# Patient Record
Sex: Male | Born: 1955
Health system: Southern US, Community
[De-identification: ages and names within clinical notes are randomized; demographics above are authoritative.]

## PROBLEM LIST (undated history)

## (undated) DIAGNOSIS — E785 Hyperlipidemia, unspecified: Secondary | ICD-10-CM

## (undated) DIAGNOSIS — I4891 Unspecified atrial fibrillation: Secondary | ICD-10-CM

## (undated) DIAGNOSIS — S76911A Strain of unspecified muscles, fascia and tendons at thigh level, right thigh, initial encounter: Secondary | ICD-10-CM

## (undated) DIAGNOSIS — T884XXA Failed or difficult intubation, initial encounter: Secondary | ICD-10-CM

## (undated) DIAGNOSIS — E559 Vitamin D deficiency, unspecified: Secondary | ICD-10-CM

## (undated) HISTORY — PX: HAGLAND'S DEFORMITY EXCISION: SHX1718

## (undated) HISTORY — DX: Strain of unspecified muscles, fascia and tendons at thigh level, right thigh, initial encounter: S76.911A

## (undated) HISTORY — PX: KNEE SURGERY: SHX244

## (undated) HISTORY — DX: Unspecified atrial fibrillation: I48.91

## (undated) HISTORY — DX: Hyperlipidemia, unspecified: E78.5

## (undated) HISTORY — DX: Vitamin D deficiency, unspecified: E55.9

---

## 2016-08-19 ENCOUNTER — Other Ambulatory Visit: Payer: Self-pay | Admitting: Orthopedic Surgery

## 2016-08-19 DIAGNOSIS — M25551 Pain in right hip: Secondary | ICD-10-CM

## 2016-08-22 ENCOUNTER — Ambulatory Visit
Admission: RE | Admit: 2016-08-22 | Discharge: 2016-08-22 | Disposition: A | Discharge status: Home / Self Care | Payer: Commercial Managed Care - PPO | Source: Ambulatory Visit | Attending: Orthopedic Surgery | Admitting: Orthopedic Surgery

## 2016-08-22 DIAGNOSIS — M25551 Pain in right hip: Secondary | ICD-10-CM

## 2016-08-24 ENCOUNTER — Ambulatory Visit: Payer: Self-pay

## 2016-08-24 ENCOUNTER — Encounter: Payer: Self-pay | Admitting: Sports Medicine

## 2016-08-24 ENCOUNTER — Ambulatory Visit (INDEPENDENT_AMBULATORY_CARE_PROVIDER_SITE_OTHER): Payer: Commercial Managed Care - PPO | Admitting: Sports Medicine

## 2016-08-24 VITALS — BP 129/83 | HR 67 | Ht 67.0 in | Wt 165.0 lb

## 2016-08-24 DIAGNOSIS — M7662 Achilles tendinitis, left leg: Secondary | ICD-10-CM

## 2016-08-24 DIAGNOSIS — M79605 Pain in left leg: Secondary | ICD-10-CM | POA: Diagnosis not present

## 2016-08-24 MED ORDER — NITROGLYCERIN 0.2 MG/HR TD PT24: MEDICATED_PATCH | TRANSDERMAL | 1 refills | Status: DC

## 2016-08-24 NOTE — Progress Notes (Signed)
   Subjective:    Patient ID: Bryan Stevens, male    DOB: September 22, 1956, 60 y.o.   MRN: [id number]  HPI   Bryan Stevens is 60 y.o. male who presents with left achilles pain. Pain began 3 months ago. Reports that in April of this year [place] and then developed plantar fascitis. He stopped running and when he returned to running in July the achilles pain started.  Patient has been a long distance runner for over 40 years. Pain is worse with running and he has taken the last 11 days off of running.   Soc Hx: Nurse, adulthead master at [employer]  Review of Systems: Edema of the left achilles.   PMH: History of achilles tendonitis and Haglund's deformity bilaterally s/p surgical repair (right in 2008, left in 2011).       Objective:   Physical Exam   Blood pressure 129/83, pulse 67, height 5\' 7"  (1.702 m), weight 165 lb (74.8 kg). Pleasant, WDWN male in NAD   Edema of the left achilles and pain/knot approximately 2.5 cm above the calcaneous.  Strength of ankle flex and ext intact bilaterally.  However complete motion of Flexion and extension of left ankle limited compared to right.  Gluteus medius strength lessened on left compared to right.   Foot shape moderately cavus Post heels with scars  US of Left AT There is nodular thickening noted Greatest AP diameter 1.02 cms Transverse view shows tendinopathic change with hypoechoic areas more in gastrocnemius portion Hypoechoic change at base with calcification Increased neovessel activity in the tendon  Assessment: chronic Achilles tendinopathy with nodular change    Assessment & Plan:   See Attestation from Preceptor for full A&P   I observed and examined the patient with the resident and agree with assessment and plan.  Note reviewed and modified by me. Sterling BigKB Fields, MD

## 2016-08-24 NOTE — Patient Instructions (Signed)

## 2016-09-01 DIAGNOSIS — M7662 Achilles tendinitis, left leg: Secondary | ICD-10-CM | POA: Insufficient documentation

## 2016-09-01 NOTE — Assessment & Plan Note (Signed)
Excellent candidate for NTG protocol  Alfredson exercises  Heel lift  Xtrain and modify  Icing  Reck 6 weeks

## 2016-09-02 ENCOUNTER — Ambulatory Visit: Payer: Self-pay | Admitting: Sports Medicine

## 2016-10-06 ENCOUNTER — Ambulatory Visit (INDEPENDENT_AMBULATORY_CARE_PROVIDER_SITE_OTHER): Payer: Commercial Managed Care - PPO | Admitting: Sports Medicine

## 2016-10-06 ENCOUNTER — Encounter: Payer: Self-pay | Admitting: Sports Medicine

## 2016-10-06 ENCOUNTER — Ambulatory Visit: Payer: Self-pay

## 2016-10-06 VITALS — BP 119/83 | HR 56 | Ht 67.0 in | Wt 168.0 lb

## 2016-10-06 DIAGNOSIS — M7662 Achilles tendinitis, left leg: Secondary | ICD-10-CM | POA: Diagnosis not present

## 2016-10-06 DIAGNOSIS — M869 Osteomyelitis, unspecified: Secondary | ICD-10-CM | POA: Diagnosis not present

## 2016-10-06 NOTE — Assessment & Plan Note (Signed)
He has made significant progress clinically  Very little change on ultrasound except for increased blood flow  Continue the same protocol for the next 2 months and then recheck with a repeat ultrasound at that time

## 2016-10-06 NOTE — Progress Notes (Signed)
Chief complaint  Left Achilles pain   Secondary complaint sports hernia on the right side  Patient is an avid [place] this past year After that time he has had a lot of pain in his right inguinal area and some in his left inguinal area He also developed swelling and pain over his left Achilles tendon Since his visit on October 24 he has been doing home exercises and a nitroglycerin protocol With those interventions he has been able to return to running without significant Achilles pain He is able to do 1 leg calf raises 3 sets of 15 No side effects from the nitroglycerin  Current limitations in running and exercise come more from pain in his groin particularly to the right side Dr. Ranell PatrickNorris had ordered an MRI of his right hip This showed some labral pathology but also edema in is pubic symphysis and adductor tendinopathy  Social history The patient is Teacher, musicheadmaster [employer] He is a nonsmoker  Review of systems Pain over his groin with lifting Pain was too much flexion of the right hip No cough or sneeze pain No radiation into the testicle Some pain at the midportion of the symphysis  Physical examination Muscular male in no acute distress BP 119/83   Pulse (!) 56   Ht 5\' 7"  (1.702 m)   Wt 168 lb (76.2 kg)   BMI 26.31 kg/m   Left Achilles still shows nodular swelling This is non tender today There is no redness He has good calf strength  Examination of his low abdomen reveals significant tenderness over the symphysis He has significant adductor weakness on the right with pain on testing Abductor strength is good Hip range of motion of the right is normal but more limited compared to the left Hip flexion and FADIR test are nonpainful No palpable hernia is noted  Left side shows some minimal tenderness at the adductor No other findings  Ultrasound of left Achilles tendon The Achilles is visualized in long and short axis There is a nodule about 4-6  cm proximal to the calcaneus The nodule measures 1.0-1.2 cm AP diameter There is less hypoechoic change in the tendon There is calcific change at the insertion that is the same as before Today he has increased neo-vessel activity consistent with nitroglycerin use  Impression: ultrasound is consistent with chronic Achilles tendinopathy with nodular change. There is an increase in vascularity and no change in structure from prior examination.  Ultrasound and interpretation OmnicomKarl Aby Gessel M.D.

## 2016-10-06 NOTE — Assessment & Plan Note (Signed)
He has adductor weakness and tightness on hip rotation  We will start with a protocol of hip exercises to correct strength deficits  And abdominal strengthening  Okay to train but avoid lifting or exercises that cause pubic area pain  Recheck 2 months

## 2016-11-01 DIAGNOSIS — J019 Acute sinusitis, unspecified: Secondary | ICD-10-CM | POA: Diagnosis not present

## 2016-12-09 ENCOUNTER — Ambulatory Visit: Payer: Commercial Managed Care - PPO | Admitting: Sports Medicine

## 2016-12-16 ENCOUNTER — Telehealth: Payer: Self-pay | Admitting: Neurology

## 2016-12-16 NOTE — Telephone Encounter (Signed)
Diannia RuderKara with Dr. Allayne Stackrossley's office called requesting to speak with Dr. Lucia GaskinsAhern.  Please call

## 2016-12-16 NOTE — Telephone Encounter (Signed)
Error

## 2016-12-16 NOTE — Telephone Encounter (Signed)
Candise BowensJen can you place patient on my schedule for next week at noon or 430 please?

## 2016-12-16 NOTE — Telephone Encounter (Signed)
Pt called back and sooner appt scheduled Tues 2/20 @ 4:00.

## 2016-12-16 NOTE — Telephone Encounter (Signed)
Called pt and offered sooner appt next week on either Tues 2/20 (@ 12 or 4) or Thurs 2/22 (@ 4:30). Asked pt to call back to schedule.

## 2016-12-16 NOTE — Telephone Encounter (Signed)
New pt appt is scheduled w/ Dr. Lucia GaskinsAhern on 01/03/17 for peripheral neuropathy.

## 2016-12-21 ENCOUNTER — Ambulatory Visit (INDEPENDENT_AMBULATORY_CARE_PROVIDER_SITE_OTHER): Payer: Commercial Managed Care - PPO | Admitting: Neurology

## 2016-12-21 ENCOUNTER — Encounter: Payer: Self-pay | Admitting: Neurology

## 2016-12-21 VITALS — Ht 67.5 in | Wt 170.2 lb

## 2016-12-21 DIAGNOSIS — G629 Polyneuropathy, unspecified: Secondary | ICD-10-CM

## 2016-12-21 NOTE — Progress Notes (Addendum)
[facility]    Provider:  Dr Lucia GaskinsAhern Referring Provider: Alysia PennaHolwerda, Scott, MD Primary Care Physician:  Alysia PennaHOLWERDA, SCOTT, MD  CC:  Neuropathy  HPI:  Bryan Stevens is a 61 y.o. male here as a referral from Dr. Link SnufferHolwerda for neuropathy. He had a severe case of the flu and sinus infection and bronchitis in December and in January he started getting numbness in the feet and fingers. It is better but not resolved. Mother with alzheimers in her 90s had memory issues for 16 years. Father with neuropathy in the feet in his 8370s and died in his 90s likely due to DM. He has tingling, paresthesias which have improved. In the ball of the foot and toes. Finges and the balls of his feet feel swollen. Feels weird in the toes. Especially when he walks. The left is worse than the right. Right has improved more though. More localized to the tips of the hands. Feels tender. There may be a little weakness as well, more sensory changes in the hands not feet. Not dropping things. 2 weeks ago he just fell, unclear why, he tripped over a crack and did not roll his ankle but just fell maybe because he couldn't feel his feet. He started getting cramping in the instep preceding these symptoms. He has to stand up to flatten out the foot. No problem with sheets touching his feet. The first few weeks it was more distressing at night. Continuous and the same all day long. Ibuprofen didn;t help. Father had neuropathy in his feet, had neuropathy at the age of 59 slowly progressive over a decade. Patient was a Pharmacist, hospitalcollege athlete, no difficulty with sports as a child or adolescent and he is a runne rnow. No back pain no radicular symptoms. No other focal neurologic deficits, associated symptoms, inciting events or modifiable factors.  Reviewed notes, labs and imaging from outside physicians, which showed: Patient was very sic late dec early January witht the flu. He developed severe numbness and tingling in the  hands/fimgers/feet/toes. Has a sinus infection and bronchitis as well. Paresthesias about a week after he was feeloing better. Feels swollen. Father had neuropathy but in the setting of DM later in life in his 1470s. No other hx of neuropathy in the past.   CMP, CBC, Vit D and B12/folate:  CMP unremarkable with BUN/Creatinine 18 and 0.9. CBC unremarkable. Vit D low 18.6. B12 673. Folate 18.9.   Review of Systems: Patient complains of symptoms per HPI as well as the following symptoms: no CP, no SOB. Pertinent negatives per HPI. All others negative.   Social History   Social History  . Marital status: Married    Spouse name: N/A  . Number of children: 3  . Years of education: BA, BS, MS, MA   Occupational History  . [employer]    Social History Main Topics  . Smoking status: Never Smoker  . Smokeless tobacco: Never Used  . Alcohol use Yes     Comment: 12 oz/mo  . Drug use: No  . Sexual activity: Not on file     Comment: Married   Other Topics Concern  . Not on file   Social History Narrative   Lives at home w/ his wife and children   Right-handed   Caffeine: 16 oz per month    Family History  Problem Relation Age of Onset  . Alzheimer's disease Mother   . Stroke Father   . Stroke Maternal Grandmother   . Colon cancer  Maternal Grandfather   . Pneumonia Paternal Grandmother   . Rheumatic fever Paternal Grandfather     Past Medical History:  Diagnosis Date  . Muscle strain of right thigh   . Vitamin D deficiency     Past Surgical History:  Procedure Laterality Date  . HAGLAND'S DEFORMITY EXCISION  09/2007, 09/2010    Current Outpatient Prescriptions  Medication Sig Dispense Refill  . cholecalciferol (VITAMIN D) 1000 units tablet Take 1,000 Units by mouth daily.    Marland Kitchen ibuprofen (ADVIL,MOTRIN) 200 MG tablet Take 400 mg by mouth every 8 (eight) hours as needed.     . Multiple Vitamin (MULTI-VITAMINS) TABS Take by mouth.    . Vitamin D, Ergocalciferol,  (DRISDOL) 50000 units CAPS capsule Take 50,000 Units by mouth every 7 (seven) days.    . nitroGLYCERIN (NITRODUR - DOSED IN MG/24 HR) 0.2 mg/hr patch Place 1/4 patch to affected area daily (Patient not taking: Reported on 12/21/2016) 30 patch 1   No current facility-administered medications for this visit.     Allergies as of 12/21/2016 - Review Complete 12/21/2016  Allergen Reaction Noted  . Gramineae pollens  01/30/2016    Vitals: Ht 5' 7.5" (1.715 m)   Wt 170 lb 3.2 oz (77.2 kg)   BMI 26.26 kg/m  Last Weight:  Wt Readings from Last 1 Encounters:  12/21/16 170 lb 3.2 oz (77.2 kg)   Last Height:   Ht Readings from Last 1 Encounters:  12/21/16 5' 7.5" (1.715 m)     Physical exam: Exam: Gen: NAD, conversant, well nourised, obese, well groomed                     CV: RRR, no MRG. No Carotid Bruits. No peripheral edema, warm, nontender Eyes: Conjunctivae clear without exudates or hemorrhage  Neuro: Detailed Neurologic Exam  Speech:    Speech is normal; fluent and spontaneous with normal comprehension.  Cognition:    The patient is oriented to person, place, and time;     recent and remote memory intact;     language fluent;     normal attention, concentration,     fund of knowledge Cranial Nerves:    The pupils are equal, round, and reactive to light. The fundi are normal and spontaneous venous pulsations are present. Visual fields are full to finger confrontation. Extraocular movements are intact. Trigeminal sensation is intact and the muscles of mastication are normal. The face is symmetric. The palate elevates in the midline. Hearing intact. Voice is normal. Shoulder shrug is normal. The tongue has normal motion without fasciculations.   Coordination:    Normal finger to nose and heel to shin. Normal rapid alternating movements.   Gait:    Heel-toe and tandem gait are normal.   Motor Observation:    No asymmetry, no atrophy, and no involuntary movements  noted. Tone:    Normal muscle tone.    Posture:    Posture is normal. normal erect    Strength:    Strength is V/V in the upper and lower limbs.      Sensation: intact to LT,  vibration and proprioception, impaired pp and temp in glove-and-stocking distribution     Reflex Exam:  DTR's:    Deep tendon reflexes in the upper and lower extremities are normal bilaterally.   Toes:    The toes are downgoing bilaterally.   Clonus:    Clonus is absent.     Assessment/Plan:  61 year old very health  nice gentleman who is high functioning and is a runner who developed paresthesias and numbness in his fingers and toes in the setting of flu, sinusitis and infection 6 weeks ago. The symptoms are continuous but improved. 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   Discussed the causes of peripheral neuropathy, the most common being diabetes which patient does not report having. About 20 million people in the [place] states have some form of peripheral neuropathy. This is a condition that develops as a result of damage to the peripheral nervous system. There are multiple causes including metabolic, toxic, infectious and endocrine disorders, small vessel disease, autoimmune diseases, and others.   -emg/ncs all 4 limbs including all 4 F wave, H reflexes  Naomie Dean, MD  [facility] [address] [place], [place] [address]  Phone [phone] Fax [phone]

## 2016-12-21 NOTE — Patient Instructions (Signed)
Remember to drink plenty of fluid, eat healthy meals and do not skip any meals. Try to eat protein with a every meal and eat a healthy snack such as fruit or nuts in between meals. Try to keep a regular sleep-wake schedule and try to exercise daily, particularly in the form of walking, 20-30 minutes a day, if you can.   As far as your medications are concerned, I would like to suggest  As far as diagnostic testing: Labs, emg/ncs  I would like to see you back for emg/ncs, sooner if we need to. Please call us with any interim questions, concerns, problems, updates or refill requests.    Our phone number is [phone]. We also have an after hours call service for urgent matters and there is a physician on-call for urgent questions. For any emergencies you know to call 911 or go to the nearest emergency room   

## 2016-12-23 ENCOUNTER — Telehealth: Payer: Self-pay | Admitting: Neurology

## 2016-12-23 NOTE — Telephone Encounter (Signed)
Can we try an get patient in this month for emg/ncs? I can do it at noon on a Friday or 430. Please call and let him know we are trying to find him a sooner spot within a week or two and see what his availability is.

## 2016-12-23 NOTE — Telephone Encounter (Signed)
Called pt and sooner appt scheduled for 01/06/17 w/ 11 am arrival time.

## 2016-12-24 ENCOUNTER — Other Ambulatory Visit (INDEPENDENT_AMBULATORY_CARE_PROVIDER_SITE_OTHER): Payer: Self-pay

## 2016-12-24 DIAGNOSIS — Z0289 Encounter for other administrative examinations: Secondary | ICD-10-CM

## 2016-12-24 DIAGNOSIS — G629 Polyneuropathy, unspecified: Secondary | ICD-10-CM | POA: Diagnosis not present

## 2016-12-29 LAB — GLIADIN ANTIBODIES, SERUM
Antigliadin Abs, IgA: 3 units (ref 0–19)
Gliadin IgG: 2 units (ref 0–19)

## 2016-12-29 LAB — ANGIOTENSIN CONVERTING ENZYME: Angio Convert Enzyme: 64 U/L (ref 14–82)

## 2016-12-29 LAB — PAN-ANCA
ANCA PROTEINASE 3: 8.9 U/mL — AB (ref 0.0–3.5)
Myeloperoxidase Ab: <9 U/mL (ref 0.0–9.0)

## 2016-12-29 LAB — MULTIPLE MYELOMA PANEL, SERUM
ALPHA 1: 0.2 g/dL (ref 0.0–0.4)
Albumin SerPl Elph-Mcnc: 3.8 g/dL (ref 2.9–4.4)
Albumin/Glob SerPl: 1.5 (ref 0.7–1.7)
Alpha2 Glob SerPl Elph-Mcnc: 0.5 g/dL (ref 0.4–1.0)
B-GLOBULIN SERPL ELPH-MCNC: 0.9 g/dL (ref 0.7–1.3)
GLOBULIN, TOTAL: 2.7 g/dL (ref 2.2–3.9)
Gamma Glob SerPl Elph-Mcnc: 1 g/dL (ref 0.4–1.8)
IGG (IMMUNOGLOBIN G), SERUM: 965 mg/dL (ref 700–1600)
IgA/Immunoglobulin A, Serum: 110 mg/dL (ref 90–386)
IgM (Immunoglobulin M), Srm: 108 mg/dL (ref 20–172)
Total Protein: 6.5 g/dL (ref 6.0–8.5)

## 2016-12-29 LAB — HEAVY METALS, BLOOD
Arsenic: 4 ug/L (ref 2–23)
Lead, Blood: NOT DETECTED ug/dL (ref 0–19)
MERCURY: NOT DETECTED ug/L (ref 0.0–14.9)

## 2016-12-29 LAB — HEPATITIS C ANTIBODY: Hep C Virus Ab: <0.1 s/co ratio (ref 0.0–0.9)

## 2016-12-29 LAB — HEMOGLOBIN A1C
Est. average glucose Bld gHb Est-mCnc: 103 mg/dL
Hgb A1c MFr Bld: 5.2 % (ref 4.8–5.6)

## 2016-12-29 LAB — ANA W/REFLEX: Anti Nuclear Antibody(ANA): NEGATIVE

## 2016-12-29 LAB — TSH: TSH: 3.56 u[IU]/mL (ref 0.450–4.500)

## 2016-12-29 LAB — B. BURGDORFI ANTIBODIES: Lyme IgG/IgM Ab: <0.91 {ISR} (ref 0.00–0.90)

## 2016-12-29 LAB — VITAMIN B6: VITAMIN B6: 43.5 ug/L (ref 5.3–46.7)

## 2016-12-29 LAB — RHEUMATOID FACTOR: Rhuematoid fact SerPl-aCnc: 18.6 IU/mL — ABNORMAL HIGH (ref 0.0–13.9)

## 2016-12-29 LAB — VITAMIN B1: Thiamine: 157.1 nmol/L (ref 66.5–200.0)

## 2016-12-29 LAB — SEDIMENTATION RATE: SED RATE: 2 mm/h (ref 0–30)

## 2016-12-29 LAB — TISSUE TRANSGLUTAMINASE, IGA: Transglutaminase IgA: <2 U/mL (ref 0–3)

## 2016-12-29 LAB — SJOGREN'S SYNDROME ANTIBODS(SSA + SSB)
ENA SSA (RO) Ab: <0.2 AI (ref 0.0–0.9)
ENA SSB (LA) Ab: <0.2 AI (ref 0.0–0.9)

## 2016-12-30 ENCOUNTER — Telehealth: Payer: Self-pay | Admitting: *Deleted

## 2016-12-30 NOTE — Telephone Encounter (Signed)
Per Dr Lucia GaskinsAhern, spoke with patient and informed him that his lab testing was unremarkable. Advised him Dr Lucia GaskinsAhern will review it in detail with him at his emg/ncs next Thursday. He verbalized understanding, appreciation.

## 2017-01-03 ENCOUNTER — Ambulatory Visit: Payer: Self-pay | Admitting: Neurology

## 2017-01-06 ENCOUNTER — Ambulatory Visit (INDEPENDENT_AMBULATORY_CARE_PROVIDER_SITE_OTHER): Payer: Self-pay | Admitting: Neurology

## 2017-01-06 ENCOUNTER — Ambulatory Visit (INDEPENDENT_AMBULATORY_CARE_PROVIDER_SITE_OTHER): Payer: Commercial Managed Care - PPO | Admitting: Neurology

## 2017-01-06 DIAGNOSIS — R202 Paresthesia of skin: Secondary | ICD-10-CM

## 2017-01-06 DIAGNOSIS — M79605 Pain in left leg: Secondary | ICD-10-CM

## 2017-01-06 DIAGNOSIS — Z0289 Encounter for other administrative examinations: Secondary | ICD-10-CM

## 2017-01-08 NOTE — Progress Notes (Addendum)
Full Name: Bryan Stevens Gender: Male MRN #: [id number] Date of Birth: 03-Jun-1956    Visit Date: 01/06/2017 11:50 Age: [age] Years 0 Months Old Examining Physician: Naomie DeanAntonia Ahern, MD  Referring Physician: Alysia PennaScott Holwerda, MD    History:   61 year old very healthy gentleman who developed paresthesias and numbness in his fingers and toes in the setting of flu, sinusitis and infection. The symptoms are improving.   Summary: All nerves and muscles (as indicated in the following tables) were within normal limits.    Conclusion: This is a normal study.  Naomie DeanAntonia Ahern, M.D.  [facility] [address] [place], [place] [address] Tel: [phone] Fax: [phone]        [facility]    Nerve / Sites Rec. Site Peak Lat Ref. Amp.1-2 Ref. Distance    ms ms V V cm  R Sural - Ankle (Calf)     Calf Ankle 3.18 ?4.40 8.2 ?6.0 14  R Median - Orthodromic (Dig II, Mid palm)     Dig II Wrist 3.13 ?3.40 8.1 ?10.0 13  R Ulnar - Orthodromic, (Dig V, Mid palm)     Dig V Wrist 2.92 ?3.10 3.1 ?5.0 11     [facility]    Nerve / Sites Muscle Latency Ref. Amplitude Ref. Rel Amp Segments Distance Lat Diff Velocity Ref. Area    ms ms mV mV %  cm ms m/s m/s mVms  R Median - APB     Wrist APB 3.6 ?4.4 4.3 ?4.0 100 Wrist - APB 7    12.9     Upper arm APB 7.4  3.9  91.1 Upper arm - Wrist 22 3.8 58  12.1  R Ulnar - ADM     Wrist ADM 2.4 ?3.3 10.6 ?6.0 100 Wrist - ADM 7    30.8     B.Elbow ADM 5.5  10.0  94.5 B.Elbow - Wrist 18 3.1 59 ?49 29.6     A.Elbow ADM 7.8  9.3  93.1 A.Elbow - B.Elbow 12 2.2 54 ?49 28.2         A.Elbow - Wrist  5.3     R Peroneal - EDB     Ankle EDB 5.3 ?6.5 4.8 ?2.0 100 Ankle - EDB 9    14.5     Fib head EDB 10.9  4.7  97.5 Fib head - Ankle 27 5.6 48 ?44 14.3     Pop fossa EDB 12.8  4.7  99.9 Pop fossa - Fib head 9 1.9 47 ?44 14.0         Pop fossa - Ankle  7.6     R Tibial - AH     Ankle AH 5.5 ?5.8 13.6 ?4.0 100 Ankle - AH 9    29.4     Pop fossa AH 13.0  10.5  77.4 Pop  fossa - Ankle 34 7.4 46 ?41 26.0     F  Wave    Nerve F Lat Ref.   ms ms  R Median - APB 28.4 ?31.0  R Ulnar - ADM 28.0 ?32.0  R Tibial - AH 50.3 ?56.0     H Reflex    Nerve H Lat Lat Hmax   ms ms   Left Right Ref. Left Right Ref.  Tibial - Soleus 34.8 34.4 ?35.0 35.5 34.6 ?35.0     EMG full       EMG Summary Table    Spontaneous MUAP Recruitment  Muscle  IA Fib PSW Fasc Other Amp Dur. Poly Pattern  R. Gastrocnemius (Medial head) Normal None None None _______ Normal Normal Normal Normal  R. Tibialis anterior Normal None None None _______ Normal Normal Normal Normal  R. Abductor hallucis Normal None None None _______ Normal Normal Normal Normal  R. Vastus medialis Normal None None None _______ Normal Normal Normal Normal  R. Extensor hallucis longus Normal None None None _______ Normal Normal Normal Normal

## 2017-01-08 NOTE — Progress Notes (Signed)
See procedure note.

## 2017-01-09 NOTE — Procedures (Signed)
Full Name: Bryan DivineSamuel Littleton Gender: Male MRN #: [id number] Date of Birth: 03-Jun-1956    Visit Date: 01/06/2017 11:50 Age: [age] Years 0 Months Old Examining Physician: Naomie DeanAntonia Ahern, MD  Referring Physician: Alysia PennaScott Holwerda, MD    History:   61 year old very healthy gentleman who developed paresthesias and numbness in his fingers and toes in the setting of flu, sinusitis and infection. The symptoms are improving.   Summary: All nerves and muscles (as indicated in the following tables) were within normal limits.    Conclusion: This is a normal study.  Naomie DeanAntonia Ahern, M.D.  [facility] [address] [place], [place] [address] Tel: [phone] Fax: [phone]        [facility]    Nerve / Sites Rec. Site Peak Lat Ref. Amp.1-2 Ref. Distance    ms ms V V cm  R Sural - Ankle (Calf)     Calf Ankle 3.18 ?4.40 8.2 ?6.0 14  R Median - Orthodromic (Dig II, Mid palm)     Dig II Wrist 3.13 ?3.40 8.1 ?10.0 13  R Ulnar - Orthodromic, (Dig V, Mid palm)     Dig V Wrist 2.92 ?3.10 3.1 ?5.0 11     [facility]    Nerve / Sites Muscle Latency Ref. Amplitude Ref. Rel Amp Segments Distance Lat Diff Velocity Ref. Area    ms ms mV mV %  cm ms m/s m/s mVms  R Median - APB     Wrist APB 3.6 ?4.4 4.3 ?4.0 100 Wrist - APB 7    12.9     Upper arm APB 7.4  3.9  91.1 Upper arm - Wrist 22 3.8 58  12.1  R Ulnar - ADM     Wrist ADM 2.4 ?3.3 10.6 ?6.0 100 Wrist - ADM 7    30.8     B.Elbow ADM 5.5  10.0  94.5 B.Elbow - Wrist 18 3.1 59 ?49 29.6     A.Elbow ADM 7.8  9.3  93.1 A.Elbow - B.Elbow 12 2.2 54 ?49 28.2         A.Elbow - Wrist  5.3     R Peroneal - EDB     Ankle EDB 5.3 ?6.5 4.8 ?2.0 100 Ankle - EDB 9    14.5     Fib head EDB 10.9  4.7  97.5 Fib head - Ankle 27 5.6 48 ?44 14.3     Pop fossa EDB 12.8  4.7  99.9 Pop fossa - Fib head 9 1.9 47 ?44 14.0         Pop fossa - Ankle  7.6     R Tibial - AH     Ankle AH 5.5 ?5.8 13.6 ?4.0 100 Ankle - AH 9    29.4     Pop fossa AH 13.0  10.5  77.4 Pop  fossa - Ankle 34 7.4 46 ?41 26.0     F  Wave    Nerve F Lat Ref.   ms ms  R Median - APB 28.4 ?31.0  R Ulnar - ADM 28.0 ?32.0  R Tibial - AH 50.3 ?56.0     H Reflex    Nerve H Lat Lat Hmax   ms ms   Left Right Ref. Left Right Ref.  Tibial - Soleus 34.8 34.4 ?35.0 35.5 34.6 ?35.0     EMG full       EMG Summary Table    Spontaneous MUAP Recruitment  Muscle  IA Fib PSW Fasc Other Amp Dur. Poly Pattern  R. Gastrocnemius (Medial head) Normal None None None _______ Normal Normal Normal Normal  R. Tibialis anterior Normal None None None _______ Normal Normal Normal Normal  R. Abductor hallucis Normal None None None _______ Normal Normal Normal Normal  R. Vastus medialis Normal None None None _______ Normal Normal Normal Normal  R. Extensor hallucis longus Normal None None None _______ Normal Normal Normal Normal

## 2017-01-09 NOTE — Addendum Note (Signed)
Addended by: Naomie DeanAHERN, Ariyana Faw B on: 01/09/2017 06:49 PM   Modules accepted: Orders

## 2017-01-21 DIAGNOSIS — H43812 Vitreous degeneration, left eye: Secondary | ICD-10-CM | POA: Diagnosis not present

## 2017-02-10 ENCOUNTER — Encounter: Payer: Commercial Managed Care - PPO | Admitting: Neurology

## 2017-02-18 DIAGNOSIS — H43812 Vitreous degeneration, left eye: Secondary | ICD-10-CM | POA: Diagnosis not present

## 2017-07-14 ENCOUNTER — Ambulatory Visit (HOSPITAL_COMMUNITY)
Admission: RE | Admit: 2017-07-14 | Discharge: 2017-07-14 | Disposition: A | Discharge status: Home / Self Care | Payer: Commercial Managed Care - PPO | Source: Ambulatory Visit | Attending: Internal Medicine | Admitting: Internal Medicine

## 2017-07-14 ENCOUNTER — Encounter (HOSPITAL_COMMUNITY): Payer: Self-pay | Admitting: Internal Medicine

## 2017-07-14 VITALS — BP 116/76 | HR 49 | Ht 67.5 in | Wt 169.8 lb

## 2017-07-14 DIAGNOSIS — R0609 Other forms of dyspnea: Secondary | ICD-10-CM | POA: Diagnosis not present

## 2017-07-14 DIAGNOSIS — Z8249 Family history of ischemic heart disease and other diseases of the circulatory system: Secondary | ICD-10-CM | POA: Insufficient documentation

## 2017-07-14 DIAGNOSIS — Z823 Family history of stroke: Secondary | ICD-10-CM | POA: Insufficient documentation

## 2017-07-14 DIAGNOSIS — E559 Vitamin D deficiency, unspecified: Secondary | ICD-10-CM | POA: Diagnosis not present

## 2017-07-14 DIAGNOSIS — Z79899 Other long term (current) drug therapy: Secondary | ICD-10-CM | POA: Insufficient documentation

## 2017-07-14 DIAGNOSIS — Z791 Long term (current) use of non-steroidal anti-inflammatories (NSAID): Secondary | ICD-10-CM | POA: Diagnosis not present

## 2017-07-14 DIAGNOSIS — Z91048 Other nonmedicinal substance allergy status: Secondary | ICD-10-CM | POA: Diagnosis not present

## 2017-07-14 DIAGNOSIS — Z800 Family history of malignant neoplasm of digestive organs: Secondary | ICD-10-CM | POA: Insufficient documentation

## 2017-07-14 DIAGNOSIS — I480 Paroxysmal atrial fibrillation: Secondary | ICD-10-CM | POA: Diagnosis present

## 2017-07-14 DIAGNOSIS — I4891 Unspecified atrial fibrillation: Secondary | ICD-10-CM

## 2017-07-14 DIAGNOSIS — R5383 Other fatigue: Secondary | ICD-10-CM | POA: Insufficient documentation

## 2017-07-14 DIAGNOSIS — R0600 Dyspnea, unspecified: Secondary | ICD-10-CM | POA: Insufficient documentation

## 2017-07-14 NOTE — Progress Notes (Signed)
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 Ballard Stevens further evaluation of AF and progressive exercise intolerance.   No significant PMHx. On no medications   In 8/16 found to have AF on routine physical exam. Completely asymptomatic at that time. Was referred to Dr. Jacinto Stevens. Had echo, stress test and ECG and heart monitor. Echo normal and no presence of AF. Referred to [facility]. Saw Dr. Smith Stevens for 2 visits. In 4/17 had 2 week heart monitor and no AF. Went back to [facility] in Fall 2017 and again no AF.   Has been struggling with injuries and weight gain and feels he is out of shape. Struggling to run even 9 or 10 min pace per mile.   Feels he has had AF the last 2 Saturdays. Was trying to do 12-14 mile runs and couldn't do more than 7-8 miles at slow pace. Felt HR was. Yesterday went out for a run again and HR was high. Could only run 1030 pace. Pulse irregular after his run. After running felt like he was going to collapse. No CP. No orthopnea or PND. A friend took his pulse and said he was irregular.    No family h/o premature CAD.  Father died from a stroke at 4 MI at 44 Mother 61 and alive 2 brothers who are fine   Review of Systems: [y] = yes,  = no   General: Weight gain ; Weight loss ; Anorexia ; Fatigue ; Fever ; Chills ; Weakness Cove.Etienne ]  Cardiac: Chest pain/pressure ; Resting SOB ; Exertional SOB Cove.Etienne ]; Orthopnea ; Pedal Edema ; Palpitations Cove.Etienne ]; Syncope ; Presyncope ; Paroxysmal nocturnal dyspnea[ ]   Pulmonary: Cough ; Wheezing[ ] ; Hemoptysis[ ] ; Sputum ; Snoring   GI: Vomiting[ ] ; Dysphagia[ ] ; Melena[ ] ; Hematochezia ; Heartburn[ ] ; Abdominal pain ; Constipation ; Diarrhea ; BRBPR   GU: Hematuria[ ] ; Dysuria ; Nocturia[ ]   Vascular: Pain in legs with walking ; Pain in feet with lying flat ; Non-healing sores ;  Stroke ; TIA ; Slurred speech ;  Neuro: Headaches[ ] ; Vertigo[ ] ; Seizures[ ] ; Paresthesias[ ] ;Blurred vision ; Diplopia ; Vision changes   Ortho/Skin: Arthritis ; Joint pain ; Muscle pain ; Joint swelling ; Back Pain ; Rash   Psych: Depression[ ] ; Anxiety[ ]   Heme: Bleeding problems ; Clotting disorders ; Anemia   Endocrine: Diabetes ; Thyroid dysfunction[ ]    Past Medical History:  Diagnosis Date  . Muscle strain of right thigh   . Vitamin D deficiency     Current Outpatient Prescriptions  Medication Sig Dispense Refill  . Multiple Vitamin (MULTI-VITAMINS) TABS Take by mouth.    . cholecalciferol (VITAMIN D) 1000 units tablet Take 1,000 Units by mouth daily.    Marland Kitchen ibuprofen (ADVIL,MOTRIN) 200 MG tablet Take 400 mg by mouth every 8 (eight) hours as needed.     . nitroGLYCERIN (NITRODUR - DOSED IN MG/24 HR) 0.2 mg/hr patch Place 1/4 patch to affected area daily (Patient not taking: Reported on 12/21/2016) 30 patch 1  . Vitamin D, Ergocalciferol, (DRISDOL) 50000 units CAPS capsule Take 50,000 Units by mouth every 7 (seven) days.  No current facility-administered medications for this encounter.     Allergies  Allergen Reactions  . Gramineae Pollens     fescue      Social History   Social History  . Marital status: Married    Spouse name: N/A  . Number of children: 3  . Years of education: BA, BS, MS, MA   Occupational History  . [employer]    Social History Main Topics  . Smoking status: Never Smoker  . Smokeless tobacco: Never Used  . Alcohol use Yes     Comment: 12 oz/mo  . Drug use: No  . Sexual activity: Not on file     Comment: Married   Other Topics Concern  . Not on file   Social History Narrative   Lives at home w/ his wife and children   Right-handed   Caffeine: 16 oz per month      Family History  Problem Relation Age of Onset  . Alzheimer's disease Mother   . Stroke Father   . Stroke  Maternal Grandmother   . Colon cancer Maternal Grandfather   . Pneumonia Paternal Grandmother   . Rheumatic fever Paternal Grandfather     Vitals:   07/14/17 1055  BP: 116/76  Pulse: (!) 49  SpO2: 100%  Weight: 169 lb 12.8 oz (77 kg)  Height: 5' 7.5" (1.715 m)    PHYSICAL EXAM: General:  Well appearing. No respiratory difficulty HEENT: normal Neck: supple. no JVD. Carotids 2+ bilat; no bruits. No lymphadenopathy or thryomegaly appreciated. Cor: PMI nondisplaced. [name] regular . No rubs, gallops or murmurs. Lungs: clear Abdomen: soft, nontender, nondistended. No hepatosplenomegaly. No bruits or masses. Good bowel sounds. Extremities: no cyanosis, clubbing, rash, edema Neuro: alert & oriented x 3, cranial nerves grossly intact. moves all 4 extremities w/o difficulty. Affect pleasant.  ECG: Sinus brady 48 early repol No ST-T wave abnormalities.   ASSESSMENT & PLAN: 1. Exertional dyspnea/fatigue - suspect may be related to recurrent AF but in NSR today.  Also have to make sure not developing CAD or LV dysfunction - check echo  - 30-day event monitor - calcium scoring - May need CPX testing.   2. PAF -place event monitor -If intermittent AF may benefit from AA versus ablation. -CHADSVASC = 0. No AC indicated.   Arvilla MeresBensimhon, Daniel, MD  9:25 PM

## 2017-07-14 NOTE — Patient Instructions (Signed)
Your physician has requested that you have an echocardiogram. Echocardiography is a painless test that uses sound waves to create images of your heart. It provides your doctor with information about the size and shape of your heart and how well your heart's chambers and valves are working. This procedure takes approximately one hour. There are no restrictions for this procedure.  Your physician has recommended that you wear an event monitor. Event monitors are medical devices that record the heart's electrical activity. Doctors most often us these monitors to diagnose arrhythmias. Arrhythmias are problems with the speed or rhythm of the heartbeat. The monitor is a small, portable device. You can wear one while you do your normal daily activities. This is usually used to diagnose what is causing palpitations/syncope (passing out).  Calcium Scoring CT scan  Your physician recommends that you schedule a follow-up appointment in: 6 weeks

## 2017-07-22 ENCOUNTER — Other Ambulatory Visit: Payer: Commercial Managed Care - PPO

## 2017-07-25 ENCOUNTER — Inpatient Hospital Stay: Admission: RE | Admit: 2017-07-25 | Payer: Commercial Managed Care - PPO | Source: Ambulatory Visit

## 2017-07-28 ENCOUNTER — Ambulatory Visit (INDEPENDENT_AMBULATORY_CARE_PROVIDER_SITE_OTHER)
Admission: RE | Admit: 2017-07-28 | Discharge: 2017-07-28 | Disposition: A | Discharge status: Home / Self Care | Payer: Commercial Managed Care - PPO | Source: Ambulatory Visit | Attending: Internal Medicine | Admitting: Internal Medicine

## 2017-07-28 ENCOUNTER — Ambulatory Visit (HOSPITAL_COMMUNITY): Payer: Commercial Managed Care - PPO | Attending: Internal Medicine

## 2017-07-28 ENCOUNTER — Other Ambulatory Visit: Payer: Self-pay

## 2017-07-28 ENCOUNTER — Ambulatory Visit (INDEPENDENT_AMBULATORY_CARE_PROVIDER_SITE_OTHER): Payer: Commercial Managed Care - PPO

## 2017-07-28 DIAGNOSIS — I340 Nonrheumatic mitral (valve) insufficiency: Secondary | ICD-10-CM | POA: Insufficient documentation

## 2017-07-28 DIAGNOSIS — R0609 Other forms of dyspnea: Secondary | ICD-10-CM

## 2017-07-28 DIAGNOSIS — I4891 Unspecified atrial fibrillation: Secondary | ICD-10-CM

## 2017-08-02 DIAGNOSIS — Z125 Encounter for screening for malignant neoplasm of prostate: Secondary | ICD-10-CM | POA: Diagnosis not present

## 2017-08-02 DIAGNOSIS — Z0000 Encounter for general adult medical examination without abnormal findings: Secondary | ICD-10-CM | POA: Diagnosis not present

## 2017-08-09 DIAGNOSIS — Z1389 Encounter for screening for other disorder: Secondary | ICD-10-CM | POA: Diagnosis not present

## 2017-08-09 DIAGNOSIS — E559 Vitamin D deficiency, unspecified: Secondary | ICD-10-CM | POA: Diagnosis not present

## 2017-08-09 DIAGNOSIS — I4891 Unspecified atrial fibrillation: Secondary | ICD-10-CM | POA: Diagnosis not present

## 2017-08-09 DIAGNOSIS — Z0000 Encounter for general adult medical examination without abnormal findings: Secondary | ICD-10-CM | POA: Diagnosis not present

## 2017-08-15 DIAGNOSIS — H25013 Cortical age-related cataract, bilateral: Secondary | ICD-10-CM | POA: Diagnosis not present

## 2017-08-15 DIAGNOSIS — H524 Presbyopia: Secondary | ICD-10-CM | POA: Diagnosis not present

## 2017-08-26 ENCOUNTER — Encounter (HOSPITAL_COMMUNITY): Payer: Commercial Managed Care - PPO | Admitting: Internal Medicine

## 2017-10-06 DIAGNOSIS — H9312 Tinnitus, left ear: Secondary | ICD-10-CM | POA: Diagnosis not present

## 2017-10-06 DIAGNOSIS — H903 Sensorineural hearing loss, bilateral: Secondary | ICD-10-CM | POA: Diagnosis not present

## 2017-10-06 DIAGNOSIS — R42 Dizziness and giddiness: Secondary | ICD-10-CM | POA: Diagnosis not present

## 2017-10-06 DIAGNOSIS — H8102 Meniere's disease, left ear: Secondary | ICD-10-CM | POA: Diagnosis not present

## 2017-10-06 DIAGNOSIS — H9313 Tinnitus, bilateral: Secondary | ICD-10-CM | POA: Diagnosis not present

## 2017-10-24 ENCOUNTER — Ambulatory Visit (HOSPITAL_COMMUNITY)
Admission: RE | Admit: 2017-10-24 | Discharge: 2017-10-24 | Disposition: A | Discharge status: Home / Self Care | Payer: Commercial Managed Care - PPO | Source: Ambulatory Visit | Attending: Internal Medicine | Admitting: Internal Medicine

## 2017-10-24 ENCOUNTER — Other Ambulatory Visit: Payer: Self-pay

## 2017-10-24 ENCOUNTER — Encounter (HOSPITAL_COMMUNITY): Payer: Self-pay | Admitting: Internal Medicine

## 2017-10-24 VITALS — BP 101/71 | HR 83 | Wt 170.5 lb

## 2017-10-24 DIAGNOSIS — R5383 Other fatigue: Secondary | ICD-10-CM | POA: Diagnosis not present

## 2017-10-24 DIAGNOSIS — Z79899 Other long term (current) drug therapy: Secondary | ICD-10-CM | POA: Insufficient documentation

## 2017-10-24 DIAGNOSIS — I480 Paroxysmal atrial fibrillation: Secondary | ICD-10-CM | POA: Diagnosis not present

## 2017-10-24 DIAGNOSIS — Z0000 Encounter for general adult medical examination without abnormal findings: Secondary | ICD-10-CM | POA: Insufficient documentation

## 2017-10-24 DIAGNOSIS — R0609 Other forms of dyspnea: Secondary | ICD-10-CM

## 2017-10-24 DIAGNOSIS — Z823 Family history of stroke: Secondary | ICD-10-CM | POA: Diagnosis not present

## 2017-10-24 DIAGNOSIS — I4891 Unspecified atrial fibrillation: Secondary | ICD-10-CM | POA: Insufficient documentation

## 2017-10-24 DIAGNOSIS — R0600 Dyspnea, unspecified: Secondary | ICD-10-CM | POA: Insufficient documentation

## 2017-10-24 MED ORDER — APIXABAN 5 MG PO TABS: 5.0000 mg | ORAL_TABLET | Freq: Two times a day (BID) | ORAL | 1 refills | Status: DC

## 2017-10-24 MED ORDER — METOPROLOL TARTRATE 25 MG PO TABS: 25.0000 mg | ORAL_TABLET | Freq: Two times a day (BID) | ORAL | 3 refills | Status: DC

## 2017-10-24 NOTE — Patient Instructions (Signed)
Start Eliquis 5 mg Twice daily   Start Metoprolol 25 mg Twice daily   Dr Gala RomneyBensimhon will follow up with you later today

## 2017-10-24 NOTE — Progress Notes (Signed)
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 Ballard RussellHowlerda further evaluation of AF and progressive exercise intolerance.   No significant PMHx. On no medications   In 8/16 found to have AF on routine physical exam. Completely asymptomatic at that time. Was referred to Dr. Jacinto HalimGanji. Had echo, stress test and ECG and heart monitor. Echo normal and no presence of AF. [facility] Saw Dr. Smith RobertMouncey for 2 visits. In 4/17 had 2 week heart monitor and no AF. [facility] and again no AF.   Calcium scoring 9/18: No coronary calcium  30-day event monitor: 9/18: No AF   Has been struggling with injuries and weight gain and feels he is out of shape. Struggling to run even 9 or 10 min pace per mile.   Today he returns for follow up. Complaining of fatigue. Thinks he went in A fib about 2 days ago.  Denies SOB/PND/Orthopnea. Appetite ok. No fever or chills. Tries to run but having to run slowly.  Taking all medications. No syncope or presyncope,.   No family h/o premature CAD.  Father died from a stroke at 7496 MI at 1084 Mother [id number] and alive 2 brothers who are fine   Past Medical History:  Diagnosis Date  . Muscle strain of right thigh   . Vitamin D deficiency     Current Outpatient Medications  Medication Sig Dispense Refill  . cholecalciferol (VITAMIN D) 1000 units tablet Take 1,000 Units by mouth daily.    Marland Kitchen. ibuprofen (ADVIL,MOTRIN) 200 MG tablet Take 400 mg by mouth every 8 (eight) hours as needed.     . Multiple Vitamin (MULTI-VITAMINS) TABS Take by mouth.    . Vitamin D, Ergocalciferol, (DRISDOL) 50000 units CAPS capsule Take 50,000 Units by mouth every 7 (seven) days.     No current facility-administered medications for this encounter.     Allergies  Allergen Reactions  . Gramineae Pollens     fescue      Social History   Socioeconomic History  . Marital  status: Married    Spouse name: Not on file  . Number of children: 3  . Years of education: BA, BS, MS, MA  . Highest education level: Not on file  Social Needs  . Financial resource strain: Not on file  . Food insecurity - worry: Not on file  . Food insecurity - inability: Not on file  . Transportation needs - medical: Not on file  . Transportation needs - non-medical: Not on file  Occupational History  . Occupation: [employer]  Tobacco Use  . Smoking status: Never Smoker  . Smokeless tobacco: Never Used  Substance and Sexual Activity  . Alcohol use: Yes    Comment: 12 oz/mo  . Drug use: No  . Sexual activity: Not on file    Comment: Married  Other Topics Concern  . Not on file  Social History Narrative   Lives at home w/ his wife and children   Right-handed   Caffeine: 16 oz per month      Family History  Problem Relation Age of Onset  . Alzheimer's disease Mother   . Stroke Father   . Stroke Maternal Grandmother   . Colon cancer Maternal Grandfather   . Pneumonia Paternal Grandmother   . Rheumatic fever Paternal Grandfather     Vitals:   10/24/17 1037  BP:  101/71  Pulse: 83  SpO2: 100%  Weight: 170 lb 8 oz (77.3 kg)    PHYSICAL EXAM: General:  Well appearing. No resp difficulty HEENT: normal Neck: supple. no JVD. Carotids 2+ bilat; no bruits. No lymphadenopathy or thryomegaly appreciated. Cor: PMI nondisplaced. Tachy. Irregular. No rubs, gallops or murmurs. Lungs: clear Abdomen: soft, nontender, nondistended. No hepatosplenomegaly. No bruits or masses. Good bowel sounds. Extremities: no cyanosis, clubbing, rash, edema Neuro: alert & orientedx3, cranial nerves grossly intact. moves all 4 extremities w/o difficulty. Affect pleasant  ECG: A fib 109 bpm personally reviewed.   ASSESSMENT & PLAN: 1. Exertional dyspnea/fatigue Likely due to A fib.  2. A fib today with RVR Start lopressor 25 mg twice a day + eliquis 5 mg twice daily  ChadsVasc  0   Follow up next week for EKG. If he doesn't convert will DC-CV. HF pharmacist educated him on metoprolol and eliquis.   Tonye BecketAmy Clegg, NP  11:12 AM   Patient seen and examined with Tonye BecketAmy Clegg, NP. We discussed all aspects of the encounter. I agree with the assessment and plan as stated above.   He has recurrent AF with RVR and has been quite symptomatic. He is now close to 48 hours out or more so not candidate for chemical or electric DC-CV today. Will start Eliquis and lopressor. If still in AF on Wednesday will arrange for DC-CV. We discussed options of flecainide or AF ablation and given his running he would prefer not to be on any AV nodal agents long-term so AF ablation likely best long-term option. Will refer back to Dr. Johney FrameAllred. We discussed case with Gypsy BalsamAmber Seiler NP from EP today.   Arvilla Meresaniel Roxy Mastandrea, MD  7:33 PM

## 2017-11-04 ENCOUNTER — Ambulatory Visit (HOSPITAL_COMMUNITY)
Admission: RE | Admit: 2017-11-04 | Discharge: 2017-11-04 | Disposition: A | Discharge status: Home / Self Care | Payer: Commercial Managed Care - PPO | Source: Ambulatory Visit | Attending: Nurse Practitioner | Admitting: Nurse Practitioner

## 2017-11-04 ENCOUNTER — Encounter (HOSPITAL_COMMUNITY): Payer: Self-pay | Admitting: Nurse Practitioner

## 2017-11-04 VITALS — BP 108/76 | HR 55 | Ht 67.5 in | Wt 173.8 lb

## 2017-11-04 DIAGNOSIS — Z7901 Long term (current) use of anticoagulants: Secondary | ICD-10-CM | POA: Diagnosis not present

## 2017-11-04 DIAGNOSIS — Z79899 Other long term (current) drug therapy: Secondary | ICD-10-CM | POA: Insufficient documentation

## 2017-11-04 DIAGNOSIS — I480 Paroxysmal atrial fibrillation: Secondary | ICD-10-CM | POA: Insufficient documentation

## 2017-11-04 DIAGNOSIS — E559 Vitamin D deficiency, unspecified: Secondary | ICD-10-CM | POA: Diagnosis not present

## 2017-11-04 DIAGNOSIS — R001 Bradycardia, unspecified: Secondary | ICD-10-CM | POA: Insufficient documentation

## 2017-11-04 DIAGNOSIS — I4891 Unspecified atrial fibrillation: Secondary | ICD-10-CM | POA: Diagnosis present

## 2017-11-04 NOTE — Progress Notes (Signed)
Primary Care Physician: Alysia PennaHolwerda, Scott, MD Referring Physician: Dr. Blane OharaBensimhon   Bryan Stevens is a 62 y.o. male with a h/o paroxysmal  afib that is in the afib clinic to be evaluated. He gives a history of being diagnosed with paroxysmal afib 06/2015, documented by PCP. He was then sent to Dr. Jacinto HalimGanji with normal cardiac w/u and then [facility].  During the time he was followed by Dr. Hurman HornMounsey, afib was quiet. [facility] and he has not been back to him. He is a long distance runner and this summer noted 4-5 episodes of afib, which dramatically affected his exercise tolerance and made him very symptomatic. He set up appointment with Dr. Gala RomneyBensimhon, his neighbor,in September.  He wore a 30 day event monitor which did not show afib. He was not on medications at that time for a chadsvasc score of 0. He went in for a f/u 12/24 and was in afib and had been for 48 hours.. He was started on BB and eliquis 5 mg bid in case cardioversion was needed. He did convert after a few days but then went back into afib for several days, converting to SR just over 24 hours ago. He recently has injured his hips and is currently not running as much as he would like, but [facility] [place] [facility] and would like to be able to precipitate in this event. He does not smoke, use excessive caffeine or alcohol.  Today, he denies symptoms of palpitations, chest pain, shortness of breath, orthopnea, PND, lower extremity edema, dizziness, presyncope, syncope, or neurologic sequela. The patient is tolerating medications without difficulties and is otherwise without complaint today.   Past Medical History:  Diagnosis Date  . Muscle strain of right thigh   . Vitamin D deficiency    Past Surgical History:  Procedure Laterality Date  . HAGLAND'S DEFORMITY EXCISION  09/2007, 09/2010    Current Outpatient Medications  Medication Sig Dispense Refill  . apixaban (ELIQUIS) 5 MG TABS  tablet Take 1 tablet (5 mg total) by mouth 2 (two) times daily. 60 tablet 1  . cholecalciferol (VITAMIN D) 1000 units tablet Take 1,000 Units by mouth daily.    Marland Kitchen. ibuprofen (ADVIL,MOTRIN) 200 MG tablet Take 400 mg by mouth every 8 (eight) hours as needed.     . metoprolol tartrate (LOPRESSOR) 25 MG tablet Take 1 tablet (25 mg total) by mouth 2 (two) times daily. 60 tablet 3  . Multiple Vitamin (MULTI-VITAMINS) TABS Take by mouth.    . Vitamin D, Ergocalciferol, (DRISDOL) 50000 units CAPS capsule Take 50,000 Units by mouth every 7 (seven) days.     No current facility-administered medications for this encounter.     Allergies  Allergen Reactions  . Gramineae Pollens     fescue    Social History   Socioeconomic History  . Marital status: Married    Spouse name: Not on file  . Number of children: 3  . Years of education: BA, BS, MS, MA  . Highest education level: Not on file  Social Needs  . Financial resource strain: Not on file  . Food insecurity - worry: Not on file  . Food insecurity - inability: Not on file  . Transportation needs - medical: Not on file  . Transportation needs - non-medical: Not on file  Occupational History  . Occupation: [employer]  Tobacco Use  . Smoking status: Never Smoker  . Smokeless  tobacco: Never Used  Substance and Sexual Activity  . Alcohol use: Yes    Comment: 12 oz/mo  . Drug use: No  . Sexual activity: Not on file    Comment: Married  Other Topics Concern  . Not on file  Social History Narrative   Lives at home w/ his wife and children   Right-handed   Caffeine: 16 oz per month    Family History  Problem Relation Age of Onset  . Alzheimer's disease Mother   . Stroke Father   . Stroke Maternal Grandmother   . Colon cancer Maternal Grandfather   . Pneumonia Paternal Grandmother   . Rheumatic fever Paternal Grandfather     ROS- All systems are reviewed and negative except as per the HPI above  Physical Exam: Vitals:    11/04/17 0833  BP: 108/76  Pulse: (!) 55  Weight: 173 lb 12.8 oz (78.8 kg)  Height: 5' 7.5" (1.715 m)   Wt Readings from Last 3 Encounters:  11/04/17 173 lb 12.8 oz (78.8 kg)  10/24/17 170 lb 8 oz (77.3 kg)  07/14/17 169 lb 12.8 oz (77 kg)    Labs: No results found for: NA, K, CL, CO2, GLUCOSE, BUN, CREATININE, CALCIUM, PHOS, MG No results found for: INR No results found for: CHOL, HDL, LDLCALC, TRIG   GEN- The patient is well appearing, alert and oriented x 3 today.   Head- normocephalic, atraumatic Eyes-  Sclera clear, conjunctiva pink Ears- hearing intact Oropharynx- clear Neck- supple, no JVP Lymph- no cervical lymphadenopathy Lungs- Clear to ausculation bilaterally, normal work of breathing Heart- Regular rate and rhythm, no murmurs, rubs or gallops, PMI not laterally displaced GI- soft, NT, ND, + BS Extremities- no clubbing, cyanosis, or edema MS- no significant deformity or atrophy Skin- no rash or lesion Psych- euthymic mood, full affect Neuro- strength and sensation are intact  EKG-Sinus brady at 55 bpm, pr int 180 ms, qrs int 96 ms, qtc 401 ms Epic records reviewed Echo-07/2017-------------------------------------------------------------------- Study Conclusions  - Left ventricle: The cavity size was normal. There was mild   concentric hypertrophy. Systolic function was normal. The   estimated ejection fraction was in the range of 60% to 65%. Wall   motion was normal; there were no regional wall motion   abnormalities. There was no evidence of elevated ventricular   filling pressure by Doppler parameters. - Mitral valve: There was mild regurgitation. - Left atrium: The atrium was mildly dilated. Volume/bsa, ES,   (1-plane Simpson&'s, A2C): 35.4 ml/m^2. - Right ventricle: The cavity size was mildly dilated. Wall   thickness was normal. - Right atrium: The atrium was mildly dilated.   Assessment and Plan: 1. Paroxysmal afib Symptomatic with afib with  decreased exercise tolerance Pt normally will have a low heart rate in the 40-50's without BB and has noted some fatigue with BB He is a long distance runner and fears he would not be able to perform at his highest ability  2/2 the side effects of  BB/AAT I feel that he is a good candidate for ablation and with his desire to continue running, discussed with Dr. Johney Frame and he is willing to do an ablation without him trying AAT first. He will continue on metoprolol 25 mg bid and eliquis 5 mg bid without missed doses, chadsvasc score of 0  He is pending an appointment next week with Dr. Nehemiah Settle C[facility] [address] [place], [place] [address] [phone]

## 2017-11-11 ENCOUNTER — Ambulatory Visit: Payer: Commercial Managed Care - PPO | Admitting: Internal Medicine

## 2017-11-11 ENCOUNTER — Encounter: Payer: Self-pay | Admitting: Internal Medicine

## 2017-11-11 VITALS — BP 114/76 | HR 48 | Ht 67.5 in | Wt 170.8 lb

## 2017-11-11 DIAGNOSIS — I480 Paroxysmal atrial fibrillation: Secondary | ICD-10-CM | POA: Diagnosis not present

## 2017-11-11 NOTE — Patient Instructions (Addendum)
Medication Instructions:  Your physician recommends that you continue on your current medications as directed. Please refer to the Current Medication list given to you today.   Labwork: Your physician recommends that you return for lab work today. BMP and CBC   Testing/Procedures:  Your physician has requested that you have cardiac CT. Cardiac computed tomography (CT) is a painless test that uses an x-ray machine to take clear, detailed pictures of your heart. For further information please visit [email or web address] Please follow instruction sheet as given.-- Needs to be done week of 1/14 prior to ablation on 1/22.  Office will call once approved by insurance.  Please arrive at the [facility] main entrance of [facility] at xx:xx AM (30-45 minutes prior to test start time)  [facility] [address] [place], [place] [address] [phone]  Proceed to the [facility] Radiology Department (First Floor).  Please follow these instructions carefully (unless otherwise directed):  Hold all erectile dysfunction medications at least 48 hours prior to test.  On the Night Before the Test: . Drink plenty of water. . Do not consume any caffeinated/decaffeinated beverages or chocolate 12 hours prior to your test. . Do not take any antihistamines 12 hours prior to your test.    On the Day of the Test: . Drink plenty of water. Do not drink any water within one hour of the test. . Do not eat any food 4 hours prior to the test. . You may take your regular medications prior to the test.  After the Test: . Drink plenty of water. . After receiving IV contrast, you may experience a mild flushed feeling. This is normal. . On occasion, you may experience a mild rash up to 24 hours after the test. This is not dangerous. If this occurs, you can take Benadryl 25 mg and increase your fluid intake. . If you experience trouble breathing, this can be serious. If it is severe call  911 IMMEDIATELY. If it is mild, please call our office. . If you take any of these medications: Glipizide/Metformin, Avandament, Glucavance, please do not take 48 hours after completing test.    Your physician has recommended that you have an ablation. Catheter ablation is a medical procedure used to treat some cardiac arrhythmias (irregular heartbeats). During catheter ablation, a long, thin, flexible tube is put into a blood vessel in your groin (upper thigh), or neck. This tube is called an ablation catheter. It is then guided to your heart through the blood vessel. Radio frequency waves destroy small areas of heart tissue where abnormal heartbeats may cause an arrhythmia to start. Please see the instruction sheet given to you today.--11/22/17  Please arrive at [facility] Entrance of [facility] at 0900am. Do not eat or drink after midnight the night prior to the procedure Do not take any medications the morning of the test Do not miss any doses of Eliquis prior to day of procedure.  Plan for one night stay Will need someone to drive you home at discharge     Follow-Up: Your physician recommends that you schedule a follow-up appointment in: 4 weeks from 11/22/17 with Rudi Coco, NP in AFib clinic and 3 months from 11/22/17 with Dr Johney Frame.    Any Other Special Instructions Will Be Listed Below (If Applicable).  Cardiac Ablation Cardiac ablation is a procedure to disable (ablate) a small amount of heart tissue in very specific places. The heart has many electrical connections. Sometimes these  connections are abnormal and can cause the heart to beat very fast or irregularly. Ablating some of the problem areas can improve the heart rhythm or return it to normal. Ablation may be done for people who:  Have Wolff-Parkinson-White syndrome.  Have fast heart rhythms (tachycardia).  Have taken medicines for an abnormal heart rhythm (arrhythmia) that were not effective or  caused side effects.  Have a high-risk heartbeat that may be life-threatening.  During the procedure, a small incision is made in the neck or the groin, and a long, thin, flexible tube (catheter) is inserted into the incision and moved to the heart. Small devices (electrodes) on the tip of the catheter will send out electrical currents. A type of X-ray (fluoroscopy) will be used to help guide the catheter and to provide images of the heart. Tell a health care provider about:  Any allergies you have.  All medicines you are taking, including vitamins, herbs, eye drops, creams, and over-the-counter medicines.  Any problems you or family members have had with anesthetic medicines.  Any blood disorders you have.  Any surgeries you have had.  Any medical conditions you have, such as kidney failure.  Whether you are pregnant or may be pregnant. What are the risks? Generally, this is a safe procedure. However, problems may occur, including:  Infection.  Bruising and bleeding at the catheter insertion site.  Bleeding into the chest, especially into the sac that surrounds the heart. This is a serious complication.  Stroke or blood clots.  Damage to other structures or organs.  Allergic reaction to medicines or dyes.  Need for a permanent pacemaker if the normal electrical system is damaged. A pacemaker is a small computer that sends electrical signals to the heart and helps your heart beat normally.  The procedure not being fully effective. This may not be recognized until months later. Repeat ablation procedures are sometimes required.  What happens before the procedure?  Follow instructions from your health care provider about eating or drinking restrictions.  Ask your health care provider about: ? Changing or stopping your regular medicines. This is especially important if you are taking diabetes medicines or blood thinners. ? Taking medicines such as aspirin and ibuprofen.  These medicines can thin your blood. Do not take these medicines before your procedure if your health care provider instructs you not to.  Plan to have someone take you home from the hospital or clinic.  If you will be going home right after the procedure, plan to have someone with you for 24 hours. What happens during the procedure?  To lower your risk of infection: ? Your health care team will wash or sanitize their hands. ? Your skin will be washed with soap. ? Hair may be removed from the incision area.  An IV tube will be inserted into one of your veins.  You will be given a medicine to help you relax (sedative).  The skin on your neck or groin will be numbed.  An incision will be made in your neck or your groin.  A needle will be inserted through the incision and into a large vein in your neck or groin.  A catheter will be inserted into the needle and moved to your heart.  Dye may be injected through the catheter to help your surgeon see the area of the heart that needs treatment.  Electrical currents will be sent from the catheter to ablate heart tissue in desired areas. There are three types of energy  that may be used to ablate heart tissue: ? Heat (radiofrequency energy). ? Laser energy. ? Extreme cold (cryoablation).  When the necessary tissue has been ablated, the catheter will be removed.  Pressure will be held on the catheter insertion area to prevent excessive bleeding.  A bandage (dressing) will be placed over the catheter insertion area. The procedure may vary among health care providers and hospitals. What happens after the procedure?  Your blood pressure, heart rate, breathing rate, and blood oxygen level will be monitored until the medicines you were given have worn off.  Your catheter insertion area will be monitored for bleeding. You will need to lie still for a few hours to ensure that you do not bleed from the catheter insertion area.  Do not drive  for 24 hours or as long as directed by your health care provider. Summary  Cardiac ablation is a procedure to disable (ablate) a small amount of heart tissue in very specific places. Ablating some of the problem areas can improve the heart rhythm or return it to normal.  During the procedure, electrical currents will be sent from the catheter to ablate heart tissue in desired areas. This information is not intended to replace advice given to you by your health care provider. Make sure you discuss any questions you have with your health care provider. Document Released: 03/06/2009 Document Revised: 09/06/2016 Document Reviewed: 09/06/2016 Elsevier Interactive Patient Education  [employer]2018 Elsevier Inc.     If you need a refill on your cardiac medications before your next appointment, please call your pharmacy.

## 2017-11-11 NOTE — H&P (View-Only) (Signed)
Electrophysiology Office Note   Date:  11/11/2017   ID:  Bryan Stevens, DOB 04-Mar-1956, MRN [id number]  PCP:  Alysia Penna, MD  Cardiologist:  Dr Gala Romney Primary Electrophysiologist: Hillis Range, MD    Chief Complaint  Patient presents with  . Atrial Fibrillation     History of Present Illness: Bryan Stevens is a 62 y.o. male who presents today for electrophysiology evaluation.   He is referred by Rudi Coco NP and Dr Gala Romney for EP consultation regarding afib.  He reports initially being diagnosed with atrial fibrillation in 2016.  He was evaluated by Dr Jacinto Halim and referred to Dr Lennox Solders at [facility].  His afib was relatively well controlled without intervention.  He is a marathon runner.  He recently has hip injuries and is recovering from this.  He would like to run the [place] marathon later this year.  He has not tolerated medical therapy well due to bradycardia.  He has had fatigue with beta blockers previously. He had multiple episodes of afib while running this past summer.  His most recent afib episode was in December.  He feels that episodes are now lasting several days at a time.  Today, he denies symptoms of palpitations, chest pain, shortness of breath, orthopnea, PND, lower extremity edema, claudication, dizziness, presyncope, syncope, bleeding, or neurologic sequela. The patient is tolerating medications without difficulties and is otherwise without complaint today.    Past Medical History:  Diagnosis Date  . Muscle strain of right thigh   . Vitamin D deficiency    Past Surgical History:  Procedure Laterality Date  . HAGLAND'S DEFORMITY EXCISION  09/2007, 09/2010     Current Outpatient Medications  Medication Sig Dispense Refill  . apixaban (ELIQUIS) 5 MG TABS tablet Take 1 tablet (5 mg total) by mouth 2 (two) times daily. 60 tablet 1  . cholecalciferol (VITAMIN D) 1000 units tablet Take 1,000 Units by mouth daily.    . metoprolol tartrate (LOPRESSOR) 25 MG tablet  Take 1 tablet (25 mg total) by mouth 2 (two) times daily. 60 tablet 3  . Multiple Vitamin (MULTI-VITAMINS) TABS Take by mouth.    . Vitamin D, Ergocalciferol, (DRISDOL) 50000 units CAPS capsule Take 50,000 Units by mouth every 7 (seven) days.     No current facility-administered medications for this visit.     Allergies:   Gramineae pollens   Social History:  The patient  reports that  has never smoked. he has never used smokeless tobacco. He reports that he drinks alcohol. He reports that he does not use drugs.   [employer]  Family History:  The patient's  family history includes Alzheimer's disease in his mother; Colon cancer in his maternal grandfather; Pneumonia in his paternal grandmother; Rheumatic fever in his paternal grandfather; Stroke in his father and maternal grandmother.    ROS:  Please see the history of present illness.   All other systems are personally reviewed and negative.    PHYSICAL EXAM: VS:  BP 114/76   Pulse (!) 48   Ht 5' 7.5" (1.715 m)   Wt 170 lb 12.8 oz (77.5 kg)   SpO2 98%   BMI 26.36 kg/m  , BMI Body mass index is 26.36 kg/m. GEN: Well nourished, well developed, in no acute distress  HEENT: normal  Neck: no JVD, carotid bruits, or masses Cardiac: RRR; no murmurs, rubs, or gallops,no edema  Respiratory:  clear to auscultation bilaterally, normal work of breathing GI: soft, nontender, nondistended, + BS MS:  no deformity or atrophy  Skin: warm and dry  Neuro:  Strength and sensation are intact Psych: euthymic mood, full affect  EKG:  EKG is ordered today. The ekg ordered today is personally reviewed and shows sinus bradycardia 48 bpm, othwerise normal ekg   Recent Labs: 12/24/2016: TSH 3.560  personally reviewed   Lipid Panel  No results found for: CHOL, TRIG, HDL, CHOLHDL, VLDL, LDLCALC, LDLDIRECT personally reviewed   Wt Readings from Last 3 Encounters:  11/11/17 170 lb 12.8 oz (77.5 kg)  11/04/17 173 lb 12.8 oz (78.8  kg)  10/24/17 170 lb 8 oz (77.3 kg)      Other studies personally reviewed: Additional studies/ records that were reviewed today include: echo, Dr Bensimhon's notes, af clinic notes  Review of the above records today demonstrates: as above   ASSESSMENT AND PLAN:  1.  Paroxysmal atrial fibrillation The patient has symptomatic afib.  He is not a candidate for AAD therapy given bradycardia. Therapeutic strategies for afib including medicine and ablation were discussed in detail with the patient today. Risk, benefits, and alternatives to EP study and radiofrequency ablation for afib were also discussed in detail today. These risks include but are not limited to stroke, bleeding, vascular damage, tamponade, perforation, damage to the esophagus, lungs, and other structures, pulmonary vein stenosis, worsening renal function, and death. The patient understands these risk and wishes to proceed.  We will therefore proceed with catheter ablation at the next available time.  Will plan cardiac CT prior to ablation to exclude LAA thrombus. chads2vasc score is 0.  Plan to stop anticoagulation 3 months post ablation.    SignedHillis Range, Elia Keenum, MD  11/11/2017 2:13 PM     [facility] [address] [place] [address] [phone] (office) [phone] (fax)

## 2017-11-11 NOTE — Progress Notes (Signed)
Electrophysiology Office Note   Date:  11/11/2017   ID:  Bryan Stevens, DOB 04-Mar-1956, MRN [id number]  PCP:  Alysia Penna, MD  Cardiologist:  Dr Gala Romney Primary Electrophysiologist: Hillis Range, MD    Chief Complaint  Patient presents with  . Atrial Fibrillation     History of Present Illness: Bryan Stevens is a 62 y.o. male who presents today for electrophysiology evaluation.   He is referred by Rudi Coco NP and Dr Gala Romney for EP consultation regarding afib.  He reports initially being diagnosed with atrial fibrillation in 2016.  He was evaluated by Dr Jacinto Halim and referred to Dr Lennox Solders at [facility].  His afib was relatively well controlled without intervention.  He is a marathon runner.  He recently has hip injuries and is recovering from this.  He would like to run the [place] marathon later this year.  He has not tolerated medical therapy well due to bradycardia.  He has had fatigue with beta blockers previously. He had multiple episodes of afib while running this past summer.  His most recent afib episode was in December.  He feels that episodes are now lasting several days at a time.  Today, he denies symptoms of palpitations, chest pain, shortness of breath, orthopnea, PND, lower extremity edema, claudication, dizziness, presyncope, syncope, bleeding, or neurologic sequela. The patient is tolerating medications without difficulties and is otherwise without complaint today.    Past Medical History:  Diagnosis Date  . Muscle strain of right thigh   . Vitamin D deficiency    Past Surgical History:  Procedure Laterality Date  . HAGLAND'S DEFORMITY EXCISION  09/2007, 09/2010     Current Outpatient Medications  Medication Sig Dispense Refill  . apixaban (ELIQUIS) 5 MG TABS tablet Take 1 tablet (5 mg total) by mouth 2 (two) times daily. 60 tablet 1  . cholecalciferol (VITAMIN D) 1000 units tablet Take 1,000 Units by mouth daily.    . metoprolol tartrate (LOPRESSOR) 25 MG tablet  Take 1 tablet (25 mg total) by mouth 2 (two) times daily. 60 tablet 3  . Multiple Vitamin (MULTI-VITAMINS) TABS Take by mouth.    . Vitamin D, Ergocalciferol, (DRISDOL) 50000 units CAPS capsule Take 50,000 Units by mouth every 7 (seven) days.     No current facility-administered medications for this visit.     Allergies:   Gramineae pollens   Social History:  The patient  reports that  has never smoked. he has never used smokeless tobacco. He reports that he drinks alcohol. He reports that he does not use drugs.   [employer]  Family History:  The patient's  family history includes Alzheimer's disease in his mother; Colon cancer in his maternal grandfather; Pneumonia in his paternal grandmother; Rheumatic fever in his paternal grandfather; Stroke in his father and maternal grandmother.    ROS:  Please see the history of present illness.   All other systems are personally reviewed and negative.    PHYSICAL EXAM: VS:  BP 114/76   Pulse (!) 48   Ht 5' 7.5" (1.715 m)   Wt 170 lb 12.8 oz (77.5 kg)   SpO2 98%   BMI 26.36 kg/m  , BMI Body mass index is 26.36 kg/m. GEN: Well nourished, well developed, in no acute distress  HEENT: normal  Neck: no JVD, carotid bruits, or masses Cardiac: RRR; no murmurs, rubs, or gallops,no edema  Respiratory:  clear to auscultation bilaterally, normal work of breathing GI: soft, nontender, nondistended, + BS MS:  no deformity or atrophy  Skin: warm and dry  Neuro:  Strength and sensation are intact Psych: euthymic mood, full affect  EKG:  EKG is ordered today. The ekg ordered today is personally reviewed and shows sinus bradycardia 48 bpm, othwerise normal ekg   Recent Labs: 12/24/2016: TSH 3.560  personally reviewed   Lipid Panel  No results found for: CHOL, TRIG, HDL, CHOLHDL, VLDL, LDLCALC, LDLDIRECT personally reviewed   Wt Readings from Last 3 Encounters:  11/11/17 170 lb 12.8 oz (77.5 kg)  11/04/17 173 lb 12.8 oz (78.8  kg)  10/24/17 170 lb 8 oz (77.3 kg)      Other studies personally reviewed: Additional studies/ records that were reviewed today include: echo, Dr Bensimhon's notes, af clinic notes  Review of the above records today demonstrates: as above   ASSESSMENT AND PLAN:  1.  Paroxysmal atrial fibrillation The patient has symptomatic afib.  He is not a candidate for AAD therapy given bradycardia. Therapeutic strategies for afib including medicine and ablation were discussed in detail with the patient today. Risk, benefits, and alternatives to EP study and radiofrequency ablation for afib were also discussed in detail today. These risks include but are not limited to stroke, bleeding, vascular damage, tamponade, perforation, damage to the esophagus, lungs, and other structures, pulmonary vein stenosis, worsening renal function, and death. The patient understands these risk and wishes to proceed.  We will therefore proceed with catheter ablation at the next available time.  Will plan cardiac CT prior to ablation to exclude LAA thrombus. chads2vasc score is 0.  Plan to stop anticoagulation 3 months post ablation.    SignedHillis Range, Neola Worrall, MD  11/11/2017 2:13 PM     [facility] [address] [place] [address] [phone] (office) [phone] (fax)

## 2017-11-12 LAB — BASIC METABOLIC PANEL
BUN / CREAT RATIO: 16 (ref 9–20)
BUN: 17 mg/dL (ref 6–24)
CHLORIDE: 103 mmol/L (ref 96–106)
CO2: 23 mmol/L (ref 20–29)
CREATININE: 1.05 mg/dL (ref 0.76–1.27)
Calcium: 9.7 mg/dL (ref 8.7–10.2)
GFR calc Af Amer: 89 mL/min/{1.73_m2} (ref >59)
GFR calc non Af Amer: 77 mL/min/{1.73_m2} (ref >59)
GLUCOSE: 101 mg/dL — AB (ref 65–99)
Potassium: 4.5 mmol/L (ref 3.5–5.2)
Sodium: 142 mmol/L (ref 134–144)

## 2017-11-12 LAB — CBC WITH DIFFERENTIAL/PLATELET
BASOS ABS: 0 10*3/uL (ref 0.0–0.2)
Basos: 1 %
EOS (ABSOLUTE): 0.1 10*3/uL (ref 0.0–0.4)
EOS: 2 %
HEMATOCRIT: 45.9 % (ref 37.5–51.0)
HEMOGLOBIN: 15.8 g/dL (ref 13.0–17.7)
IMMATURE GRANULOCYTES: 0 %
Immature Grans (Abs): 0 10*3/uL (ref 0.0–0.1)
LYMPHS ABS: 2 10*3/uL (ref 0.7–3.1)
Lymphs: 30 %
MCH: 29.4 pg (ref 26.6–33.0)
MCHC: 34.4 g/dL (ref 31.5–35.7)
MCV: 86 fL (ref 79–97)
MONOCYTES: 8 %
Monocytes Absolute: 0.5 10*3/uL (ref 0.1–0.9)
Neutrophils Absolute: 3.9 10*3/uL (ref 1.4–7.0)
Neutrophils: 59 %
Platelets: 274 10*3/uL (ref 150–379)
RBC: 5.37 x10E6/uL (ref 4.14–5.80)
RDW: 14.2 % (ref 12.3–15.4)
WBC: 6.5 10*3/uL (ref 3.4–10.8)

## 2017-11-14 ENCOUNTER — Telehealth: Payer: Self-pay | Admitting: *Deleted

## 2017-11-14 DIAGNOSIS — H8142 Vertigo of central origin, left ear: Secondary | ICD-10-CM | POA: Diagnosis not present

## 2017-11-14 DIAGNOSIS — H9042 Sensorineural hearing loss, unilateral, left ear, with unrestricted hearing on the contralateral side: Secondary | ICD-10-CM | POA: Diagnosis not present

## 2017-11-14 NOTE — Telephone Encounter (Signed)
There are no spots available to do cardiac CT the week we are needing.  Discussed with Dr Johney FrameAllred and will do a TEE instead the morning of the ablation.  I have scheduled the TEE with Dr Eden EmmsNishan for 11/22/17.  He is to be at the hospital at 7:30am.  All other instructions are the same.  He is aware of the above and was appreciative of my call back to discuss changes with him.

## 2017-11-22 ENCOUNTER — Ambulatory Visit (HOSPITAL_COMMUNITY): Payer: Commercial Managed Care - PPO | Admitting: Certified Registered Nurse Anesthetist

## 2017-11-22 ENCOUNTER — Encounter (HOSPITAL_COMMUNITY): Payer: Self-pay | Admitting: *Deleted

## 2017-11-22 ENCOUNTER — Ambulatory Visit (HOSPITAL_COMMUNITY)
Admission: RE | Admit: 2017-11-22 | Discharge: 2017-11-22 | Disposition: A | Discharge status: Home / Self Care | Payer: Commercial Managed Care - PPO | Source: Ambulatory Visit | Attending: Internal Medicine | Admitting: Internal Medicine

## 2017-11-22 ENCOUNTER — Other Ambulatory Visit: Payer: Self-pay

## 2017-11-22 ENCOUNTER — Ambulatory Visit (HOSPITAL_BASED_OUTPATIENT_CLINIC_OR_DEPARTMENT_OTHER): Payer: Commercial Managed Care - PPO

## 2017-11-22 ENCOUNTER — Encounter (HOSPITAL_COMMUNITY)
Admission: RE | Disposition: A | Discharge status: Home / Self Care | Payer: Self-pay | Source: Ambulatory Visit | Attending: Cardiovascular Disease

## 2017-11-22 DIAGNOSIS — I480 Paroxysmal atrial fibrillation: Secondary | ICD-10-CM | POA: Insufficient documentation

## 2017-11-22 DIAGNOSIS — I4819 Other persistent atrial fibrillation: Secondary | ICD-10-CM

## 2017-11-22 DIAGNOSIS — E559 Vitamin D deficiency, unspecified: Secondary | ICD-10-CM | POA: Diagnosis not present

## 2017-11-22 DIAGNOSIS — Z79899 Other long term (current) drug therapy: Secondary | ICD-10-CM | POA: Insufficient documentation

## 2017-11-22 DIAGNOSIS — I4891 Unspecified atrial fibrillation: Secondary | ICD-10-CM | POA: Diagnosis not present

## 2017-11-22 DIAGNOSIS — I340 Nonrheumatic mitral (valve) insufficiency: Secondary | ICD-10-CM | POA: Diagnosis not present

## 2017-11-22 DIAGNOSIS — Z7901 Long term (current) use of anticoagulants: Secondary | ICD-10-CM | POA: Diagnosis not present

## 2017-11-22 HISTORY — PX: ATRIAL FIBRILLATION ABLATION: EP1191

## 2017-11-22 HISTORY — DX: Failed or difficult intubation, initial encounter: T88.4XXA

## 2017-11-22 HISTORY — PX: TEE WITHOUT CARDIOVERSION: SHX5443

## 2017-11-22 LAB — POCT ACTIVATED CLOTTING TIME
ACTIVATED CLOTTING TIME: 268 s
ACTIVATED CLOTTING TIME: 334 s
ACTIVATED CLOTTING TIME: 175 s
Activated Clotting Time: 296 seconds
Activated Clotting Time: 191 seconds

## 2017-11-22 SURGERY — ATRIAL FIBRILLATION ABLATION
Anesthesia: General

## 2017-11-22 SURGERY — ECHOCARDIOGRAM, TRANSESOPHAGEAL
Anesthesia: Moderate Sedation

## 2017-11-22 MED ORDER — BUPIVACAINE HCL (PF) 0.25 % IJ SOLN
INTRAMUSCULAR | Status: DC | PRN
  Administered 2017-11-22: 30 mL

## 2017-11-22 MED ORDER — FENTANYL CITRATE (PF) 100 MCG/2ML IJ SOLN
INTRAMUSCULAR | Status: AC
  Filled 2017-11-22: qty 2

## 2017-11-22 MED ORDER — BUPIVACAINE HCL (PF) 0.25 % IJ SOLN
INTRAMUSCULAR | Status: AC
  Filled 2017-11-22: qty 30

## 2017-11-22 MED ORDER — ACETAMINOPHEN 325 MG PO TABS: 650.0000 mg | ORAL_TABLET | ORAL | Status: DC | PRN

## 2017-11-22 MED ORDER — IOPAMIDOL (ISOVUE-370) INJECTION 76%
INTRAVENOUS | Status: AC
  Filled 2017-11-22: qty 50

## 2017-11-22 MED ORDER — SODIUM CHLORIDE 0.9 % IV SOLN
INTRAVENOUS | Status: DC | PRN
  Administered 2017-11-22: 11:00:00 via INTRAVENOUS

## 2017-11-22 MED ORDER — SODIUM CHLORIDE 0.9 % IV SOLN: 250.0000 mL | INTRAVENOUS | Status: DC | PRN

## 2017-11-22 MED ORDER — HEPARIN (PORCINE) IN NACL 2-0.9 UNIT/ML-% IJ SOLN
INTRAMUSCULAR | Status: AC | PRN
  Administered 2017-11-22: 500 mL

## 2017-11-22 MED ORDER — OFF THE BEAT BOOK
Freq: Once | Status: AC
  Administered 2017-11-22: 21:00:00
  Filled 2017-11-22: qty 1

## 2017-11-22 MED ORDER — DEXAMETHASONE SODIUM PHOSPHATE 10 MG/ML IJ SOLN
INTRAMUSCULAR | Status: DC | PRN
  Administered 2017-11-22: 10 mg via INTRAVENOUS

## 2017-11-22 MED ORDER — MIDAZOLAM HCL 5 MG/ML IJ SOLN
INTRAMUSCULAR | Status: AC
  Filled 2017-11-22: qty 2

## 2017-11-22 MED ORDER — FENTANYL CITRATE (PF) 100 MCG/2ML IJ SOLN
INTRAMUSCULAR | Status: DC | PRN
  Administered 2017-11-22: 50 ug via INTRAVENOUS

## 2017-11-22 MED ORDER — ISOPROTERENOL HCL 0.2 MG/ML IJ SOLN
INTRAVENOUS | Status: DC | PRN
  Administered 2017-11-22: 20 ug/min via INTRAVENOUS

## 2017-11-22 MED ORDER — SODIUM CHLORIDE 0.9 % IV SOLN
INTRAVENOUS | Status: DC
  Administered 2017-11-22: 08:00:00 via INTRAVENOUS

## 2017-11-22 MED ORDER — FENTANYL CITRATE (PF) 100 MCG/2ML IJ SOLN
INTRAMUSCULAR | Status: DC | PRN
  Administered 2017-11-22 (×2): 25 ug via INTRAVENOUS
  Administered 2017-11-22 (×2): 50 ug via INTRAVENOUS

## 2017-11-22 MED ORDER — BUTAMBEN-TETRACAINE-BENZOCAINE 2-2-14 % EX AERO
INHALATION_SPRAY | CUTANEOUS | Status: DC | PRN
  Administered 2017-11-22: 2 via TOPICAL

## 2017-11-22 MED ORDER — SODIUM CHLORIDE 0.9 % IV SOLN: INTRAVENOUS | Status: DC

## 2017-11-22 MED ORDER — HEPARIN SODIUM (PORCINE) 1000 UNIT/ML IJ SOLN
INTRAMUSCULAR | Status: DC | PRN
  Administered 2017-11-22 (×2): 4000 [IU] via INTRAVENOUS

## 2017-11-22 MED ORDER — PHENYLEPHRINE HCL 10 MG/ML IJ SOLN
INTRAMUSCULAR | Status: DC | PRN
  Administered 2017-11-22 (×6): 80 ug via INTRAVENOUS

## 2017-11-22 MED ORDER — EPHEDRINE SULFATE 50 MG/ML IJ SOLN
INTRAMUSCULAR | Status: DC | PRN
  Administered 2017-11-22: 5 mg via INTRAVENOUS

## 2017-11-22 MED ORDER — LIDOCAINE HCL (CARDIAC) 20 MG/ML IV SOLN
INTRAVENOUS | Status: DC | PRN
  Administered 2017-11-22: 80 mg via INTRAVENOUS

## 2017-11-22 MED ORDER — MIDAZOLAM HCL 5 MG/5ML IJ SOLN
INTRAMUSCULAR | Status: DC | PRN
  Administered 2017-11-22: 2 mg via INTRAVENOUS

## 2017-11-22 MED ORDER — HEPARIN SODIUM (PORCINE) 1000 UNIT/ML IJ SOLN
INTRAMUSCULAR | Status: AC
  Filled 2017-11-22: qty 1

## 2017-11-22 MED ORDER — HYDROCODONE-ACETAMINOPHEN 5-325 MG PO TABS: 1.0000 | ORAL_TABLET | ORAL | Status: DC | PRN

## 2017-11-22 MED ORDER — APIXABAN 5 MG PO TABS
5.0000 mg | ORAL_TABLET | Freq: Two times a day (BID) | ORAL | Status: DC
  Administered 2017-11-22: 5 mg via ORAL
  Filled 2017-11-22: qty 1

## 2017-11-22 MED ORDER — ISOPROTERENOL HCL 0.2 MG/ML IJ SOLN
INTRAMUSCULAR | Status: AC
  Filled 2017-11-22: qty 5

## 2017-11-22 MED ORDER — HEPARIN SODIUM (PORCINE) 1000 UNIT/ML IJ SOLN
INTRAMUSCULAR | Status: DC | PRN
  Administered 2017-11-22: 12000 [IU] via INTRAVENOUS
  Administered 2017-11-22: 1000 [IU] via INTRAVENOUS

## 2017-11-22 MED ORDER — SODIUM CHLORIDE 0.9% FLUSH: 3.0000 mL | INTRAVENOUS | Status: DC | PRN

## 2017-11-22 MED ORDER — PROPOFOL 10 MG/ML IV BOLUS
INTRAVENOUS | Status: DC | PRN
  Administered 2017-11-22: 200 mg via INTRAVENOUS

## 2017-11-22 MED ORDER — PANTOPRAZOLE SODIUM 40 MG PO TBEC: 40.0000 mg | DELAYED_RELEASE_TABLET | Freq: Every day | ORAL | 0 refills | Status: DC

## 2017-11-22 MED ORDER — PROTAMINE SULFATE 10 MG/ML IV SOLN
INTRAVENOUS | Status: DC | PRN
  Administered 2017-11-22: 30 mg via INTRAVENOUS

## 2017-11-22 MED ORDER — METOPROLOL TARTRATE 25 MG PO TABS: 12.5000 mg | ORAL_TABLET | Freq: Two times a day (BID) | ORAL | 3 refills | Status: DC

## 2017-11-22 MED ORDER — ONDANSETRON HCL 4 MG/2ML IJ SOLN
INTRAMUSCULAR | Status: DC | PRN
  Administered 2017-11-22: 4 mg via INTRAVENOUS

## 2017-11-22 MED ORDER — IOPAMIDOL (ISOVUE-370) INJECTION 76%
INTRAVENOUS | Status: DC | PRN
  Administered 2017-11-22: 3 mL via INTRAVENOUS

## 2017-11-22 MED ORDER — SODIUM CHLORIDE 0.9% FLUSH: 3.0000 mL | Freq: Two times a day (BID) | INTRAVENOUS | Status: DC

## 2017-11-22 MED ORDER — MIDAZOLAM HCL 10 MG/2ML IJ SOLN
INTRAMUSCULAR | Status: DC | PRN
  Administered 2017-11-22 (×2): 2 mg via INTRAVENOUS
  Administered 2017-11-22: 1 mg via INTRAVENOUS

## 2017-11-22 MED ORDER — ONDANSETRON HCL 4 MG/2ML IJ SOLN: 4.0000 mg | Freq: Four times a day (QID) | INTRAMUSCULAR | Status: DC | PRN

## 2017-11-22 SURGICAL SUPPLY — 15 items
BLANKET WARM UNDERBOD FULL ACC (MISCELLANEOUS) ×3 IMPLANT
CATH NAVISTAR SMARTTOUCH DF (ABLATOR) ×3 IMPLANT
CATH SOUNDSTAR 3D IMAGING (CATHETERS) ×3 IMPLANT
CATH VARIABLE LASSO NAV 2515 (CATHETERS) ×3 IMPLANT
CATH WEBSTER BI DIR CS D-F CRV (CATHETERS) ×3 IMPLANT
NEEDLE TRANSEP BRK 71CM 407200 (NEEDLE) ×3 IMPLANT
PACK EP LATEX FREE (CUSTOM PROCEDURE TRAY) ×2
PACK EP LF (CUSTOM PROCEDURE TRAY) ×1 IMPLANT
PAD DEFIB LIFELINK (PAD) ×3 IMPLANT
PATCH CARTO3 (PAD) ×3 IMPLANT
SHEATH AVANTI 11F 11CM (SHEATH) ×3 IMPLANT
SHEATH PINNACLE 7F 10CM (SHEATH) ×6 IMPLANT
SHEATH PINNACLE 9F 10CM (SHEATH) ×3 IMPLANT
SHEATH SWARTZ TS SL2 63CM 8.5F (SHEATH) ×3 IMPLANT
TUBING SMART ABLATE COOLFLOW (TUBING) ×3 IMPLANT

## 2017-11-22 NOTE — Plan of Care (Signed)
  Education: Knowledge of General Education information will improve 11/22/2017 2142 - Completed/Met by Tish Frederickson, [place] Behavior/Discharge Planning: Ability to manage health-related needs will improve 11/22/2017 2142 - Completed/Met by Tish Frederickson, RN   Clinical Measurements: Ability to maintain clinical measurements within normal limits will improve 11/22/2017 2142 - Completed/Met by Tish Frederickson, RN Will remain free from infection 11/22/2017 2142 - Completed/Met by Tish Frederickson, RN Diagnostic test results will improve 11/22/2017 2142 - Completed/Met by Tish Frederickson, RN Respiratory complications will improve 11/22/2017 2142 - Completed/Met by Tish Frederickson, RN Cardiovascular complication will be avoided 11/22/2017 2142 - Completed/Met by Tish Frederickson, RN   Clinical Measurements: Will remain free from infection 11/22/2017 2142 - Completed/Met by Tish Frederickson, RN   Clinical Measurements: Diagnostic test results will improve 11/22/2017 2142 - Completed/Met by Tish Frederickson, RN   Clinical Measurements: Respiratory complications will improve 11/22/2017 2142 - Completed/Met by Tish Frederickson, RN   Clinical Measurements: Cardiovascular complication will be avoided 11/22/2017 2142 - Completed/Met by Tish Frederickson, RN

## 2017-11-22 NOTE — Discharge Summary (Signed)
ELECTROPHYSIOLOGY PROCEDURE DISCHARGE SUMMARY    Patient ID: Bryan Stevens,  MRN: [id number], DOB/AGE: 60/08/1956 60 y.o.  Admit date: 11/22/2017 Discharge date: 11/22/17  Primary Care Physician: Alysia Penna, MD  Primary Cardiologist: Dr. Gala Romney Electrophysiologist: Hillis Range, MD  Primary Discharge Diagnosis:  1. Paroxysmal Afib     CHA2DS2Vasc is zero, on eliquis peri-procedure   Procedures This Admission:  1.  Electrophysiology study and radiofrequency catheter ablation on 11/22/17 by Dr Hillis Range.   This study demonstrated   CONCLUSIONS: 1. Sinus rhythm upon presentation.   2. Intracardiac echo reveals a moderate sized left atrium with four separate pulmonary veins without evidence of pulmonary vein stenosis. 3. Successful electrical isolation and anatomical encircling of all four pulmonary veins with radiofrequency current. 4. No inducible arrhythmias following ablation both on and off of Isuprel 5. No early apparent complications  Brief HPI: Bryan Stevens is a 62 y.o. male with a history of paroxysmal atrial fibrillation.  The patient not felt to be AAD candidate 2/2 bradycardia. Risks, benefits, and alternatives to catheter ablation of atrial fibrillation were reviewed with the patient who wished to proceed.  The patient underwent TEE prior to the procedure which demonstrated normal LV function and no LAA thrombus.    Hospital Course:  The patient was admitted and underwent EPS/RFCA of atrial fibrillation with details as outlined above.  He was monitored on telemetry which demonstrated sinus rhythm.   Groin was without complication at time of discharge.  The patient was examined by Dr. Johney Frame and considered to be stable for discharge.  Wound care and restrictions were reviewed with the patient.  The patient will be seen back by Rudi Coco, NP in 4 weeks and Dr Johney Frame in 12 weeks for post ablation follow up.    Physical Exam: Vitals:   11/22/17 1515 11/22/17 1520  11/22/17 1525 11/22/17 1700  BP: 112/67 109/63 106/69 124/73  Pulse: 62 64 62   Resp: 11 12 10  (!) 27  Temp:      TempSrc:      SpO2: 97% 97% 99%   Weight:      Height:        GEN- The patient is well appearing, alert and oriented x 3 today.   HEENT: normocephalic, atraumatic; sclera clear, conjunctiva pink; hearing intact; oropharynx clear; neck supple  Lungs-  CTA b/l, normal work of breathing.  No wheezes, rales, rhonchi Heart-  RRR, no murmurs, rubs or gallops  GI- soft, non-tender, non-distended Extremities- no clubbing, cyanosis, or edema;  DP/PT/radial pulses 2+ bilaterally,  groin without hematoma/bruit MS- no significant deformity or atrophy Skin- warm and dry, no rash or lesion Psych- euthymic mood, full affect Neuro- strength and sensation are intact   Labs:   Lab Results  Component Value Date   WBC 6.5 11/11/2017   HGB 15.8 11/11/2017   HCT 45.9 11/11/2017   MCV 86 11/11/2017   PLT 274 11/11/2017   No results for input(s): NA, K, CL, CO2, BUN, CREATININE, CALCIUM, PROT, BILITOT, ALKPHOS, ALT, AST, GLUCOSE in the last 168 hours.  Invalid input(s): LABALBU   Discharge Medications:  Allergies as of 11/22/2017      Reactions   Gramineae Pollens Other (See Comments)   fescue      Medication List    TAKE these medications   apixaban 5 MG Tabs tablet Commonly known as:  ELIQUIS Take 1 tablet (5 mg total) by mouth 2 (two) times daily.   cholecalciferol 1000 units tablet Commonly  known as:  VITAMIN D Take 1,000 Units by mouth daily.   metoprolol tartrate 25 MG tablet Commonly known as:  LOPRESSOR Take 0.5 tablets (12.5 mg total) by mouth 2 (two) times daily. What changed:  how much to take   MULTI-VITAMINS Tabs Take 1 tablet by mouth daily.   pantoprazole 40 MG tablet Commonly known as:  PROTONIX Take 1 tablet (40 mg total) by mouth daily.   REFRESH OP Place 1-2 drops into both eyes daily as needed (for dry eyes).       Disposition:   Home   Follow-up Information    [facility] Follow up on 12/21/2017.   Specialty:  Cardiology Why:  2:00PM Contact information: [address] [id number] [address] [place] [id number] [phone]       Hillis RangeAllred, Kristal Perl, MD Follow up on 02/27/2018.   Specialty:  Cardiology Why:  2:15PM Contact information: [address] [place] [address] [phone]             Randolm IdolSigned, Colson Barco MD 11/22/2017 5:42 PM

## 2017-11-22 NOTE — Transfer of Care (Signed)
Immediate Anesthesia Transfer of Care Note  Patient: Bryan DivineSamuel Stevens  Procedure(s) Performed: ATRIAL FIBRILLATION ABLATION (N/A )  Patient Location: PACU and Cath Lab  Anesthesia Type:General  Level of Consciousness: awake, alert , oriented and patient cooperative  Airway & Oxygen Therapy: Patient Spontanous Breathing  Post-op Assessment: Report given to RN and Post -op Vital signs reviewed and stable  Post vital signs: Reviewed and stable  Last Vitals:  Vitals:   11/22/17 0935 11/22/17 0945  BP: 115/72 116/72  Pulse: (!) 55 (!) 58  Resp: 14 15  Temp: 36.4 C   SpO2: 95% 97%    Last Pain:  Vitals:   11/22/17 0935  TempSrc: Oral         Complications: No apparent anesthesia complications

## 2017-11-22 NOTE — Discharge Instructions (Signed)
Post procedure care No driving for 4 days. No lifting over 5 lbs for 1 week. No vigorous or sexual activity for 1 week. You may return to work in one week. Keep procedure site clean & dry. If you notice increased pain, swelling, bleeding or pus, call/return!  You may shower, but no soaking baths/hot tubs/pools for 1 week.   You have an appointment set up with the At[facility] Fibrillation Clinic.  Multiple studies have shown that being followed by a dedicated atrial fibrillation clinic in addition to the standard care you receive from your other physicians improves health. We believe that enrollment in the atrial fibrillation clinic will allow us to better care for you.   The phone number to the Atrial Fibrillation Clinic is [phone]. The clinic is staffed Monday through Friday from 8:30am to 5pm.  Parking Directions: The clinic is located in the Heart [facility] Vascular Building [facility] to [facility]. 1)From [address] on to [address] and go to the 3rd entrance  (Heart and Vascular entrance) on the right. 2)Look to the right for Heart &Vascular Parking Garage. 3)A code for the entrance is required please call the clinic to receive this.   4)Take the elevators to the 1st floor. Registration is in the room with the glass walls at the end of the hallway.  If you have any trouble parking or locating the clinic, please dont hesitate to call [phone].  Call Dr Johney FrameAllred with concerns [phone] (cell)

## 2017-11-22 NOTE — Progress Notes (Signed)
Patient discharged to home after bedrest was over and patient ambulated. Groin site is a level zero with no evidence of hematoma. Patient is pain free with stable vital signs. Heart rhythm is normal sinus rhythm with a rate of 60-70. Discharge instructions given to patient and all questions answered. Patient went home with significant other.

## 2017-11-22 NOTE — Interval H&P Note (Signed)
History and Physical Interval Note:  11/22/2017 7:32 AM  Bryan DivineSamuel Virts  has presented today for surgery, with the diagnosis of afib  The various methods of treatment have been discussed with the patient and family. After consideration of risks, benefits and other options for treatment, the patient has consented to  Procedure(s): ATRIAL FIBRILLATION ABLATION (N/A) as a surgical intervention .  The patient's history has been reviewed, patient examined, no change in status, stable for surgery.  I have reviewed the patient's chart and labs.  Questions were answered to the patient's satisfaction.     Hillis RangeJames Wylma Tatem

## 2017-11-22 NOTE — Progress Notes (Signed)
Doing well s/p ablation VSS No concerns Groin is without hematoma  DC to home at 9pm  Hillis RangeJames Kollyn Lingafelter MD, [facility] 11/22/2017 5:39 PM

## 2017-11-22 NOTE — Anesthesia Procedure Notes (Signed)
Procedure Name: LMA Insertion Date/Time: 11/22/2017 11:11 AM Performed by: Adonis Housekeeperongell, Breonia Kirstein M, CRNA Pre-anesthesia Checklist: Patient identified, Emergency Drugs available, Suction available and Patient being monitored Patient Re-evaluated:Patient Re-evaluated prior to induction Oxygen Delivery Method: Circle system utilized Preoxygenation: Pre-oxygenation with 100% oxygen Induction Type: IV induction Ventilation: Mask ventilation without difficulty LMA: LMA inserted LMA Size: 4.0 Number of attempts: 1 Placement Confirmation: positive ETCO2 and breath sounds checked- equal and bilateral Tube secured with: Tape Dental Injury: Teeth and Oropharynx as per pre-operative assessment

## 2017-11-22 NOTE — Progress Notes (Addendum)
Site area: RFV x3 Site Prior to Removal:  Level 0 Pressure Applied For:45 min Manual:   yes Patient Status During Pull:  stable Post Pull Site:  Level 0 Post Pull Instructions Given:yes   Post Pull Pulses Present: palpable Dressing Applied:  tegaderm Bedrest begins @ 1530 till 2130 Comments: removed by Erie NoeVanessa M--6 E / canderson

## 2017-11-22 NOTE — Anesthesia Postprocedure Evaluation (Signed)
Anesthesia Post Note  Patient: Bryan Stevens  Procedure(s) Performed: ATRIAL FIBRILLATION ABLATION (N/A )     Patient location during evaluation: PACU Anesthesia Type: General Level of consciousness: awake and alert Pain management: pain level controlled Vital Signs Assessment: post-procedure vital signs reviewed and stable Respiratory status: spontaneous breathing, nonlabored ventilation, respiratory function stable and patient connected to nasal cannula oxygen Cardiovascular status: blood pressure returned to baseline and stable Postop Assessment: no apparent nausea or vomiting Anesthetic complications: no    Last Vitals:  Vitals:   11/22/17 1350 11/22/17 1355  BP: 118/70 115/70  Pulse: 70 67  Resp: (!) 9 (!) 8  Temp:    SpO2: 95% 97%    Last Pain:  Vitals:   11/22/17 1342  TempSrc: Temporal                 Emry Barbato S

## 2017-11-22 NOTE — Interval H&P Note (Signed)
History and Physical Interval Note:  11/22/2017 8:08 AM  Bryan DivineSamuel Hamidi  has presented today for surgery, with the diagnosis of AFIB  The various methods of treatment have been discussed with the patient and family. After consideration of risks, benefits and other options for treatment, the patient has consented to  Procedure(s): TRANSESOPHAGEAL ECHOCARDIOGRAM (TEE) (N/A) as a surgical intervention .  The patient's history has been reviewed, patient examined, no change in status, stable for surgery.  I have reviewed the patient's chart and labs.  Questions were answered to the patient's satisfaction.     Charlton HawsPeter Ely Ballen

## 2017-11-22 NOTE — CV Procedure (Signed)
TEE During this procedure the patient is administered a total of Versed 4 mg and Fentanyl 50 mg to achieve and maintain moderate conscious sedation.  The patient's heart rate, blood pressure, and oxygen saturation are monitored continuously during the procedure. The period of conscious sedation is 30 minutes, of which I was present face-to-face 100% of this time.  No LAA thrombus No PFO/ASD EF 55-60%  Mild MR Normal AV No pericardial effusion Normal RV  Charlton HawsPeter Savon Bordonaro

## 2017-11-22 NOTE — Anesthesia Preprocedure Evaluation (Signed)
Anesthesia Evaluation  Patient identified by MRN, date of birth, ID band Patient awake    Reviewed: Allergy & Precautions, NPO status , Patient's Chart, lab work & pertinent test results  Airway Mallampati: II  TM Distance: >3 FB Neck ROM: Full    Dental no notable dental hx.    Pulmonary neg pulmonary ROS,    Pulmonary exam normal breath sounds clear to auscultation       Cardiovascular Normal cardiovascular exam+ dysrhythmias Atrial Fibrillation  Rhythm:Regular Rate:Normal     Neuro/Psych negative neurological ROS  negative psych ROS   GI/Hepatic negative GI ROS, Neg liver ROS,   Endo/Other  negative endocrine ROS  Renal/GU negative Renal ROS  negative genitourinary   Musculoskeletal negative musculoskeletal ROS (+)   Abdominal   Peds negative pediatric ROS (+)  Hematology negative hematology ROS (+)   Anesthesia Other Findings   Reproductive/Obstetrics negative OB ROS                             Anesthesia Physical Anesthesia Plan  ASA: III  Anesthesia Plan: MAC   Post-op Pain Management:    Induction: Intravenous  PONV Risk Score and Plan: 1 and Ondansetron  Airway Management Planned: Simple Face Mask  Additional Equipment:   Intra-op Plan:   Post-operative Plan:   Informed Consent: I have reviewed the patients History and Physical, chart, labs and discussed the procedure including the risks, benefits and alternatives for the proposed anesthesia with the patient or authorized representative who has indicated his/her understanding and acceptance.   Dental advisory given  Plan Discussed with: CRNA and Surgeon  Anesthesia Plan Comments:         Anesthesia Quick Evaluation

## 2017-11-23 ENCOUNTER — Encounter (HOSPITAL_COMMUNITY): Payer: Self-pay | Admitting: Internal Medicine

## 2017-12-02 DIAGNOSIS — M25551 Pain in right hip: Secondary | ICD-10-CM | POA: Diagnosis not present

## 2017-12-02 DIAGNOSIS — S76311A Strain of muscle, fascia and tendon of the posterior muscle group at thigh level, right thigh, initial encounter: Secondary | ICD-10-CM | POA: Diagnosis not present

## 2017-12-21 ENCOUNTER — Ambulatory Visit (HOSPITAL_COMMUNITY)
Admission: RE | Admit: 2017-12-21 | Discharge: 2017-12-21 | Disposition: A | Discharge status: Home / Self Care | Payer: Commercial Managed Care - PPO | Source: Ambulatory Visit | Attending: Nurse Practitioner | Admitting: Nurse Practitioner

## 2017-12-21 ENCOUNTER — Encounter (HOSPITAL_COMMUNITY): Payer: Self-pay | Admitting: Nurse Practitioner

## 2017-12-21 VITALS — BP 112/72 | HR 45 | Ht 67.5 in | Wt 174.0 lb

## 2017-12-21 DIAGNOSIS — Z7902 Long term (current) use of antithrombotics/antiplatelets: Secondary | ICD-10-CM | POA: Diagnosis not present

## 2017-12-21 DIAGNOSIS — Z9889 Other specified postprocedural states: Secondary | ICD-10-CM | POA: Diagnosis not present

## 2017-12-21 DIAGNOSIS — E559 Vitamin D deficiency, unspecified: Secondary | ICD-10-CM | POA: Insufficient documentation

## 2017-12-21 DIAGNOSIS — I480 Paroxysmal atrial fibrillation: Secondary | ICD-10-CM

## 2017-12-21 DIAGNOSIS — Z79899 Other long term (current) drug therapy: Secondary | ICD-10-CM | POA: Diagnosis not present

## 2017-12-21 DIAGNOSIS — Z800 Family history of malignant neoplasm of digestive organs: Secondary | ICD-10-CM | POA: Insufficient documentation

## 2017-12-21 DIAGNOSIS — Z823 Family history of stroke: Secondary | ICD-10-CM | POA: Diagnosis not present

## 2017-12-21 DIAGNOSIS — Z820 Family history of epilepsy and other diseases of the nervous system: Secondary | ICD-10-CM | POA: Insufficient documentation

## 2017-12-21 MED ORDER — APIXABAN 5 MG PO TABS: 5.0000 mg | ORAL_TABLET | Freq: Two times a day (BID) | ORAL | 3 refills | Status: DC

## 2017-12-21 NOTE — Addendum Note (Signed)
Encounter addended by: Newman Niparroll, Escarlet Saathoff C, NP on: 12/21/2017 2:30 PM  Actions taken: Sign clinical note

## 2017-12-21 NOTE — Progress Notes (Addendum)
Primary Care Physician: Alysia PennaHolwerda, Scott, MD Referring Physician: Dr. Abel PrestoAllred   Bryan Stevens is a 62 y.o. male with a h/o paroxsymal afib that underwent ablation,one month ago. He is in the afib clinic for f/u. He has had very little afib early on after procedure. He is trying to increase his running to [place]. Is asking if he can reduce BB in half, to help increase his running performance. Compliant with Eliquis. No swallowing or groin procedures.  Today, he denies symptoms of palpitations, chest pain, shortness of breath, orthopnea, PND, lower extremity edema, dizziness, presyncope, syncope, or neurologic sequela. The patient is tolerating medications without difficulties and is otherwise without complaint today.   Past Medical History:  Diagnosis Date  . Difficult intubation    has "crooked trachea"  . Muscle strain of right thigh   . Vitamin D deficiency    Past Surgical History:  Procedure Laterality Date  . ATRIAL FIBRILLATION ABLATION N/A 11/22/2017   Procedure: ATRIAL FIBRILLATION ABLATION;  Surgeon: Hillis RangeAllred, James, MD;  [facility];  Service: Cardiovascular;  Laterality: N/A;  . HAGLAND'S DEFORMITY EXCISION  09/2007, 09/2010  . TEE WITHOUT CARDIOVERSION N/A 11/22/2017   Procedure: TRANSESOPHAGEAL ECHOCARDIOGRAM (TEE);  Surgeon: Wendall StadeNishan, Peter C, MD;  [facility];  Service: Cardiovascular;  Laterality: N/A;    Current Outpatient Medications  Medication Sig Dispense Refill  . apixaban (ELIQUIS) 5 MG TABS tablet Take 1 tablet (5 mg total) by mouth 2 (two) times daily. 60 tablet 3  . cholecalciferol (VITAMIN D) 1000 units tablet Take 1,000 Units by mouth daily.    . metoprolol tartrate (LOPRESSOR) 25 MG tablet Take 0.5 tablets (12.5 mg total) by mouth 2 (two) times daily. (Patient taking differently: Take 25 mg by mouth 2 (two) times daily. ) 60 tablet 3  . Multiple Vitamin (MULTI-VITAMINS) TABS Take 1 tablet by  mouth daily.     . Polyvinyl Alcohol-Povidone (REFRESH OP) Place 1-2 drops into both eyes daily as needed (for dry eyes).      No current facility-administered medications for this encounter.     Allergies  Allergen Reactions  . Gramineae Pollens Other (See Comments)    fescue    Social History   Socioeconomic History  . Marital status: Married    Spouse name: Not on file  . Number of children: 3  . Years of education: BA, BS, MS, MA  . Highest education level: Not on file  Social Needs  . Financial resource strain: Not on file  . Food insecurity - worry: Not on file  . Food insecurity - inability: Not on file  . Transportation needs - medical: Not on file  . Transportation needs - non-medical: Not on file  Occupational History  . Occupation: [employer]  Tobacco Use  . Smoking status: Never Smoker  . Smokeless tobacco: Never Used  Substance and Sexual Activity  . Alcohol use: Yes    Comment: 12 oz/mo  . Drug use: No  . Sexual activity: Not on file    Comment: Married  Other Topics Concern  . Not on file  Social History Narrative   Lives at home w/ his wife and children   Right-handed   Caffeine: 16 oz per month    Family History  Problem Relation Age of Onset  . Alzheimer's disease Mother   . Stroke Father   . Stroke Maternal Grandmother   . Colon cancer Maternal Grandfather   .  Pneumonia Paternal Grandmother   . Rheumatic fever Paternal Grandfather     ROS- All systems are reviewed and negative except as per the HPI above  Physical Exam: Vitals:   12/21/17 1353  BP: 112/72  Pulse: (!) 45  Weight: 174 lb (78.9 kg)  Height: 5' 7.5" (1.715 m)   Wt Readings from Last 3 Encounters:  12/21/17 174 lb (78.9 kg)  11/22/17 170 lb (77.1 kg)  11/11/17 170 lb 12.8 oz (77.5 kg)    Labs: Lab Results  Component Value Date   NA 142 11/11/2017   K 4.5 11/11/2017   CL 103 11/11/2017   CO2 23 11/11/2017   GLUCOSE 101 (H) 11/11/2017   BUN 17 11/11/2017    CREATININE 1.05 11/11/2017   CALCIUM 9.7 11/11/2017   No results found for: INR No results found for: CHOL, HDL, LDLCALC, TRIG   GEN- The patient is well appearing, alert and oriented x 3 today.   Head- normocephalic, atraumatic Eyes-  Sclera clear, conjunctiva pink Ears- hearing intact Oropharynx- clear Neck- supple, no JVP Lymph- no cervical lymphadenopathy Lungs- Clear to ausculation bilaterally, normal work of breathing Heart- Regular rate and rhythm, no murmurs, rubs or gallops, PMI not laterally displaced GI- soft, NT, ND, + BS Extremities- no clubbing, cyanosis, or edema MS- no significant deformity or atrophy Skin- no rash or lesion Psych- euthymic mood, full affect Neuro- strength and sensation are intact  EKG- Sinus brady at 45 bpm, pr int 184 ms, qrs int 86 ms, qtc 411 ms Epic records reviewed    Assessment and Plan: 1. Paroxysmal afib, s/p ablation  SR since procedure with little afib burden  May cut metoprolol in half but if increase in burden, go back to full dose  Continue peri procedure DOAC, do not interrupt for any reason during the 3 month healing period Pt hopes to continue to walk on his running distance and participate in the [place] marathon in early April, he does plan to touch base with Dr. Johney Frame at that time to make sure ok to go, but he may have to walk/run it this year  F/u with Dr. Johney Frame 4/29  [place]vina Sidle. [facility] [address] [place], [place] [address] [phone]

## 2018-01-16 ENCOUNTER — Telehealth: Payer: Self-pay | Admitting: Internal Medicine

## 2018-01-16 NOTE — Telephone Encounter (Signed)
I spoke with pt. [place].  He thought Dr. Johney FrameAllred had said he could stop Eliquis and metoprolol 3 months post ablation.  He reports he had one episode of atrial fib 13 days post ablation. None since. Has been taking half metoprolol dose since he was seen in afib clinic on 2/20.   Has decreased endurance while taking metoprolol and would like to stop prior to marathon if OK with Dr. Johney FrameAllred. Would also like to stop Eliquis if OK with Dr. Johney FrameAllred.   Asking if OK if he runs marathon. Will forward to Dr. Johney FrameAllred for review/recommendations.

## 2018-01-16 NOTE — Telephone Encounter (Signed)
New Message   Pt c/o medication issue:  1. Name of Medication:   metoprolol tartrate (LOPRESSOR) 25 MG tablet     2. How are you currently taking this medication (dosage and times per day)? Take 25 mg by mouth 2 (two) times daily.   3. Are you having a reaction (difficulty breathing--STAT)? None  4. What is your medication issue? Patient is wanting to know how much longer will he need to take the beta blocker. [place] also [place] in [place] and would like to know is he still okay to participate as well as can he stop the beta blocker prior to then.

## 2018-01-19 NOTE — Telephone Encounter (Signed)
Left detailed message per DPR.   Per Dr. Georgiann MohsAllred-ok to stop metoprolol.  Continue Eliquis until February 10, 2018.  Do not stop Eliquis before that date.  [place] to [place]. Left this nurse name and # if any further needs.

## 2018-02-15 ENCOUNTER — Encounter: Payer: Self-pay | Admitting: Internal Medicine

## 2018-02-27 ENCOUNTER — Encounter: Payer: Self-pay | Admitting: Internal Medicine

## 2018-02-27 ENCOUNTER — Ambulatory Visit: Payer: Commercial Managed Care - PPO | Admitting: Internal Medicine

## 2018-02-27 VITALS — BP 122/72 | HR 61 | Ht 67.5 in | Wt 172.0 lb

## 2018-02-27 DIAGNOSIS — I480 Paroxysmal atrial fibrillation: Secondary | ICD-10-CM

## 2018-02-27 NOTE — Progress Notes (Signed)
   PCP: Alysia Penna, MD Primary Cardiologist: Dr Blane Ohara Bryan Stevens is a 62 y.o. male who presents today for routine electrophysiology followup.  Since his recent afib ablation, the patient reports doing very well.  he denies procedure related complications and is pleased with the results of the procedure. He recently ran the [place] marathon without difficulty.  Today, he denies symptoms of palpitations, chest pain, shortness of breath,  lower extremity edema, dizziness, presyncope, or syncope.  The patient is otherwise without complaint today.   Past Medical History:  Diagnosis Date  . Difficult intubation    has "crooked trachea"  . Muscle strain of right thigh   . Vitamin D deficiency    Past Surgical History:  Procedure Laterality Date  . ATRIAL FIBRILLATION ABLATION N/A 11/22/2017   Procedure: ATRIAL FIBRILLATION ABLATION;  Surgeon: Hillis Range, MD;  Location: [facility];  Service: Cardiovascular;  Laterality: N/A;  . HAGLAND'S DEFORMITY EXCISION  09/2007, 09/2010  . TEE WITHOUT CARDIOVERSION N/A 11/22/2017   Procedure: TRANSESOPHAGEAL ECHOCARDIOGRAM (TEE);  Surgeon: Wendall Stade, MD;  Location: [facility] ENDOSCOPY;  Service: Cardiovascular;  Laterality: N/A;    ROS- all systems are personally reviewed and negatives except as per HPI above  Current Outpatient Medications  Medication Sig Dispense Refill  . apixaban (ELIQUIS) 5 MG TABS tablet Take 1 tablet (5 mg total) by mouth 2 (two) times daily. 60 tablet 3  . cholecalciferol (VITAMIN D) 1000 units tablet Take 1,000 Units by mouth daily.    . metoprolol tartrate (LOPRESSOR) 25 MG tablet Take 0.5 tablets (12.5 mg total) by mouth 2 (two) times daily. (Patient taking differently: Take 25 mg by mouth 2 (two) times daily. ) 60 tablet 3  . Multiple Vitamin (MULTI-VITAMINS) TABS Take 1 tablet by mouth daily.     . Polyvinyl Alcohol-Povidone (REFRESH OP) Place 1-2 drops into both eyes daily as needed (for dry eyes).      No  current facility-administered medications for this visit.     Physical Exam: Vitals:   02/27/18 1430  BP: 122/72  Pulse: 61  SpO2: 97%  Weight: 172 lb (78 kg)  Height: 5' 7.5" (1.715 m)    GEN- The patient is well appearing, alert and oriented x 3 today.   Head- normocephalic, atraumatic Eyes-  Sclera clear, conjunctiva pink Ears- hearing intact Oropharynx- clear Lungs- Clear to ausculation bilaterally, normal work of breathing Heart- Regular rate and rhythm, no murmurs, rubs or gallops, PMI not laterally displaced GI- soft, NT, ND, + BS Extremities- no clubbing, cyanosis, or edema  EKG tracing ordered today is personally reviewed and shows sinus rhythm 61 bpm, PR 176 msec, QRS 88 msec, QTc 410 msec  Assessment and Plan:  1. Paroxysmal atrial fibrillation Doing well s/p ablation chads2vasc score is 0 Stop anticoagulation at this time   Return to see me in 3 months  Hillis Range MD, [facility] 02/27/2018 2:35 PM

## 2018-02-27 NOTE — Patient Instructions (Signed)
Medication Instructions:  Your physician recommends that you continue on your current medications as directed. Please refer to the Current Medication list given to you today.  Labwork: None ordered  Testing/Procedures: None ordered  Follow-Up: Your physician recommends that you schedule a follow-up appointment in: 3 months with Dr. Allred.  * If you need a refill on your cardiac medications before your next appointment, please call your pharmacy.   *Please note that any paperwork needing to be filled out by the provider will need to be addressed at the front desk prior to seeing the provider. Please note that any FMLA, disability or other documents regarding health condition is subject to a $25.00 charge that must be received prior to completion of paperwork in the form of a money order or check.  Thank you for choosing [facility]!!          

## 2018-05-29 ENCOUNTER — Encounter: Payer: Self-pay | Admitting: Internal Medicine

## 2018-05-29 ENCOUNTER — Ambulatory Visit (INDEPENDENT_AMBULATORY_CARE_PROVIDER_SITE_OTHER): Payer: Commercial Managed Care - PPO | Admitting: Internal Medicine

## 2018-05-29 ENCOUNTER — Encounter (INDEPENDENT_AMBULATORY_CARE_PROVIDER_SITE_OTHER): Payer: Self-pay

## 2018-05-29 VITALS — BP 132/72 | HR 63 | Ht 67.5 in | Wt 170.0 lb

## 2018-05-29 DIAGNOSIS — I480 Paroxysmal atrial fibrillation: Secondary | ICD-10-CM

## 2018-05-29 NOTE — Progress Notes (Signed)
   PCP: Alysia PennaHolwerda, Scott, MD Primary Cardiologist: Dr Gala RomneyBensimhon Primary EP: Dr Gena FrayAllred  Bryan Stevens is a 62 y.o. male who presents today for routine electrophysiology followup.  Since last being seen in our clinic, the patient reports doing very well.  Today, he denies symptoms of palpitations, chest pain, shortness of breath,  lower extremity edema, dizziness, presyncope, or syncope.  The patient is otherwise without complaint today.   Past Medical History:  Diagnosis Date  . Difficult intubation    has "crooked trachea"  . Muscle strain of right thigh   . Vitamin D deficiency    Past Surgical History:  Procedure Laterality Date  . ATRIAL FIBRILLATION ABLATION N/A 11/22/2017   Procedure: ATRIAL FIBRILLATION ABLATION;  Surgeon: Hillis RangeAllred, Francille Wittmann, MD;  [facility];  Service: Cardiovascular;  Laterality: N/A;  . HAGLAND'S DEFORMITY EXCISION  09/2007, 09/2010  . TEE WITHOUT CARDIOVERSION N/A 11/22/2017   Procedure: TRANSESOPHAGEAL ECHOCARDIOGRAM (TEE);  Surgeon: Wendall StadeNishan, Peter C, MD;  [facility];  Service: Cardiovascular;  Laterality: N/A;    ROS- all systems are reviewed and negatives except as per HPI above  Current Outpatient Medications  Medication Sig Dispense Refill  . cholecalciferol (VITAMIN D) 1000 units tablet Take 1,000 Units by mouth daily.    . Multiple Vitamin (MULTI-VITAMINS) TABS Take 1 tablet by mouth daily.     . Polyvinyl Alcohol-Povidone (REFRESH OP) Place 1-2 drops into both eyes daily as needed (for dry eyes).      No current facility-administered medications for this visit.     Physical Exam: Vitals:   05/29/18 1559  BP: 132/72  Pulse: 63  Weight: 170 lb (77.1 kg)  Height: 5' 7.5" (1.715 m)    GEN- The patient is well appearing, alert and oriented x 3 today.   Head- normocephalic, atraumatic Eyes-  Sclera clear, conjunctiva pink Ears- hearing intact Oropharynx- clear Lungs- Clear to ausculation bilaterally, normal work of  breathing Heart- Regular rate and rhythm, no murmurs, rubs or gallops, PMI not laterally displaced GI- soft, NT, ND, + BS Extremities- no clubbing, cyanosis, or edema  Wt Readings from Last 3 Encounters:  05/29/18 170 lb (77.1 kg)  02/27/18 172 lb (78 kg)  12/21/17 174 lb (78.9 kg)    EKG tracing ordered today is personally reviewed and shows sinus rhythm, early repolarization  Assessment and Plan:  1. Paroxysmal atrial fibrillation Doing well s/p ablation off AADs chads2vasc score is 0.  No indication for anticoaguation  Return in 6 months unless problems arise  Hillis RangeJames Keeton Kassebaum MD, [facility] 05/29/2018 4:19 PM

## 2018-05-29 NOTE — Patient Instructions (Addendum)
Medication Instructions:  Your physician recommends that you continue on your current medications as directed. Please refer to the Current Medication list given to you today.   Labwork: None ordered   Testing/Procedures: None ordered   Follow-Up: Your physician wants you to follow-up in: 6 months with Dr. Allred    Any Other Special Instructions Will Be Listed Below (If Applicable).     If you need a refill on your cardiac medications before your next appointment, please call your pharmacy.   

## 2018-06-17 IMAGING — CT CT HEART SCORING
2 series · 16 of 20 positions shown, 18 images · non-contrast
Comparison: None.

CLINICAL DATA: Risk stratification

EXAM:
Coronary Calcium Score
TECHNIQUE: The patient was scanned on a Siemens Force scanner. Axial
non-contrast 3 mm slices were carried out through the heart. The
data set was analyzed on a dedicated work station and scored using
the Agatson method.

[Series 3: casc 3.0 i36f 2 bestdiast 69 % · axial · 0.36mm/px · z∈[-281,-188]mm · 8 of 41 slices shown, 10 images]
[im 5/41  vessel]
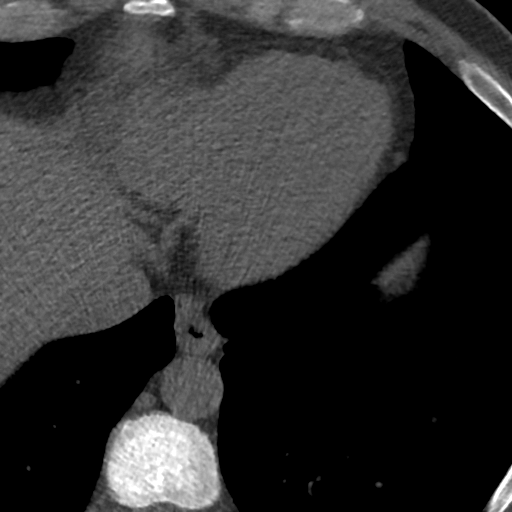
[im 5/41  lung]
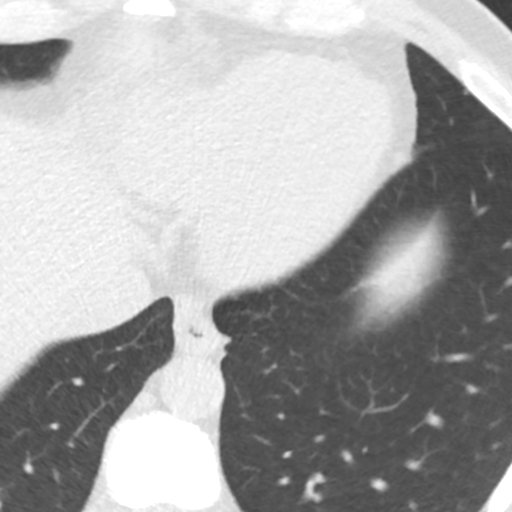
[im 9/41  vessel]
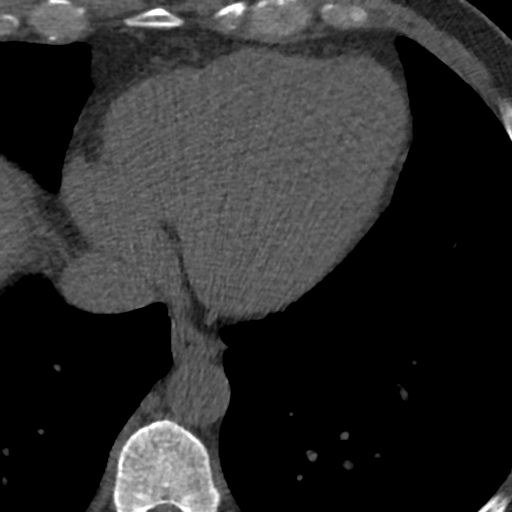
[im 14/41  vessel]
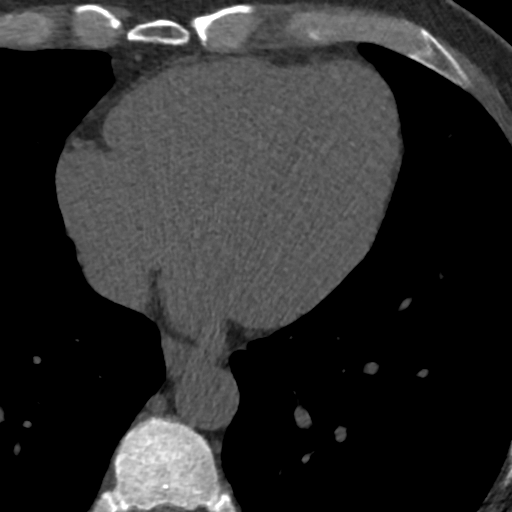
[im 18/41  vessel]
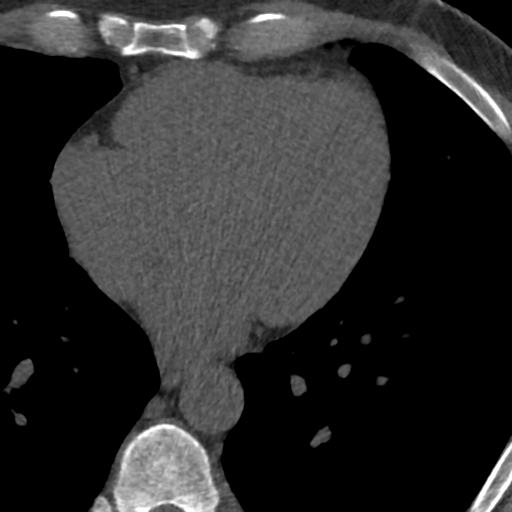
[im 23/41  vessel]
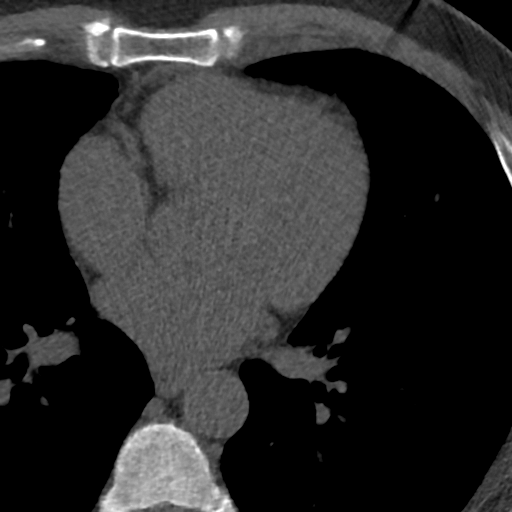
[im 23/41  lung]
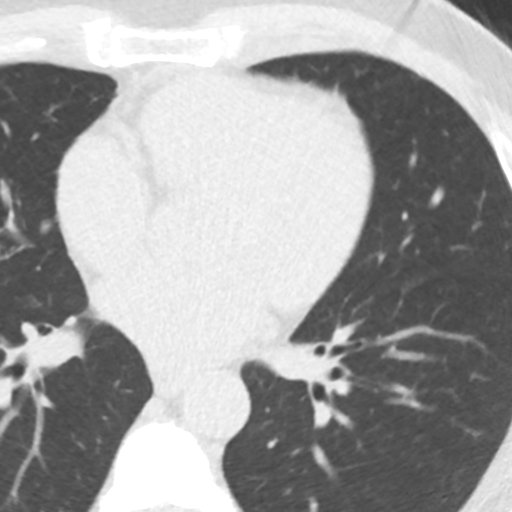
[im 27/41  vessel]
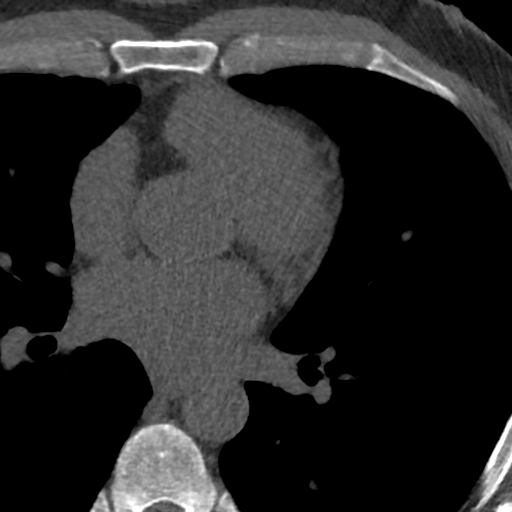
[im 32/41  vessel]
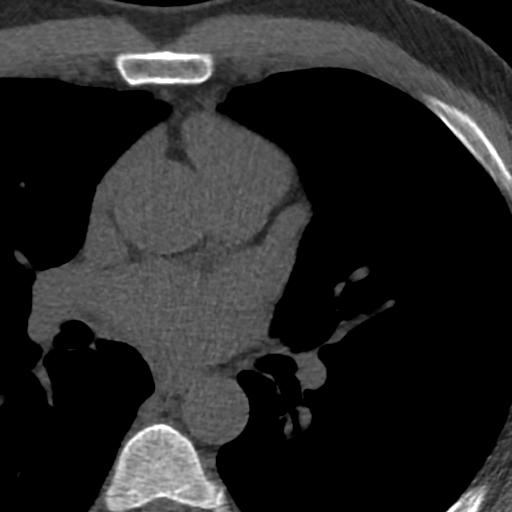
[im 36/41  vessel]
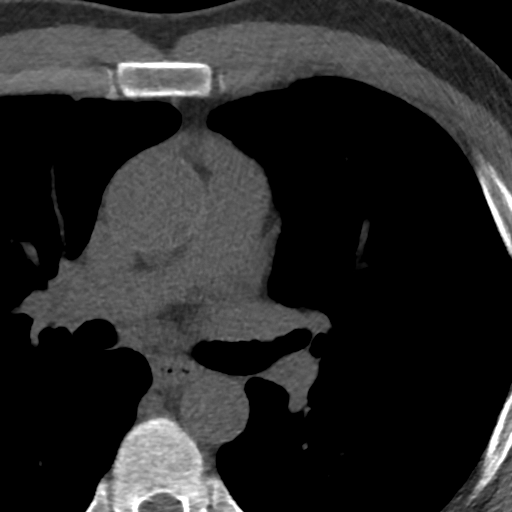

[Series 5: lung st 70 % · axial · 0.68mm/px · z∈[-282,-184]mm · 8 of 43 slices shown]
[im 5/43  lung]
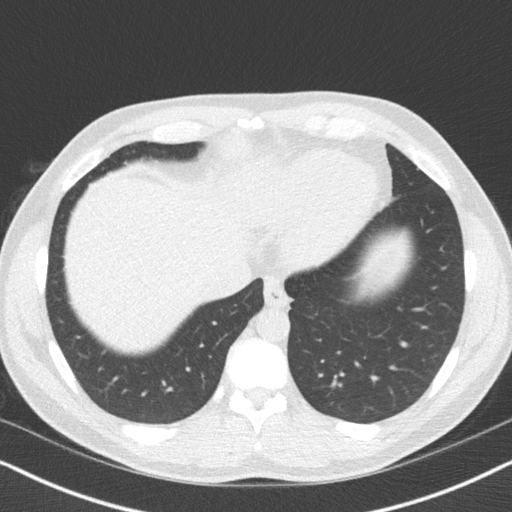
[im 10/43  lung]
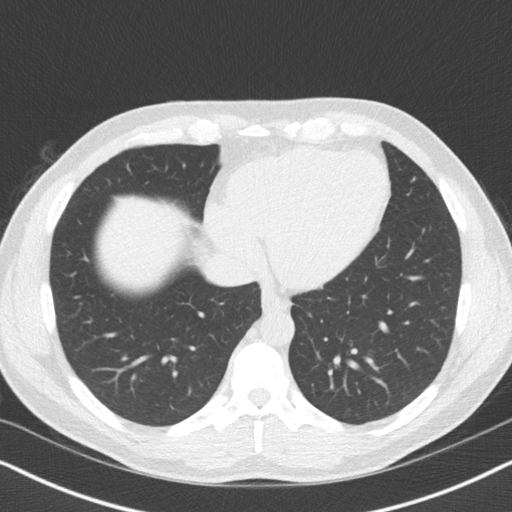
[im 15/43  lung]
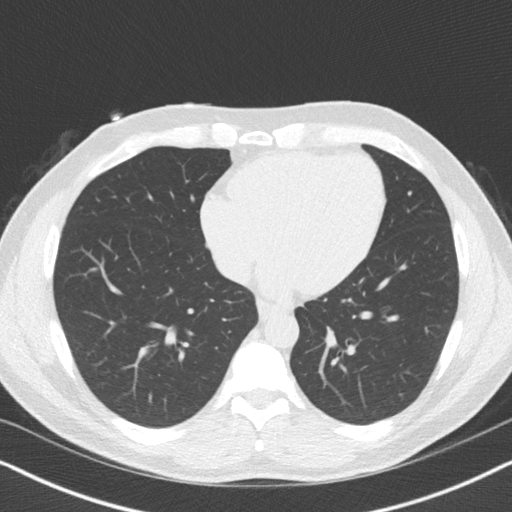
[im 19/43  lung]
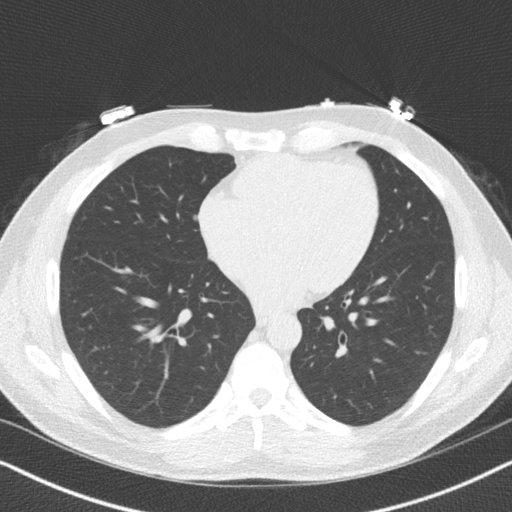
[im 24/43  lung]
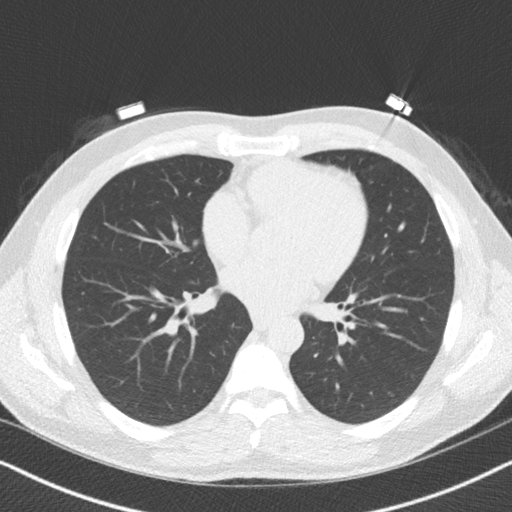
[im 29/43  lung]
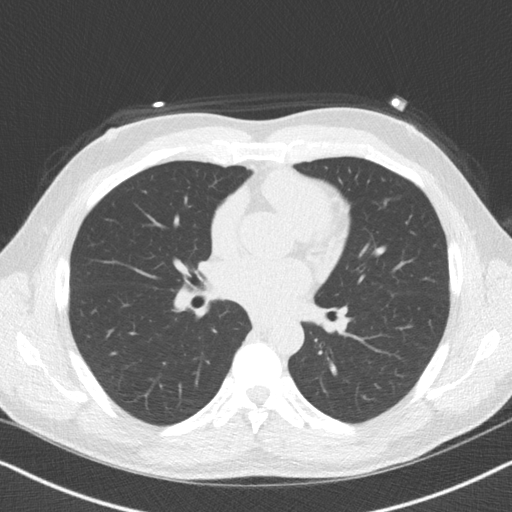
[im 33/43  lung]
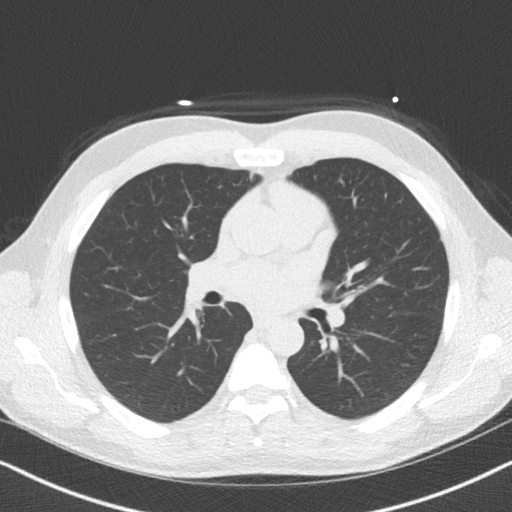
[im 38/43  lung]
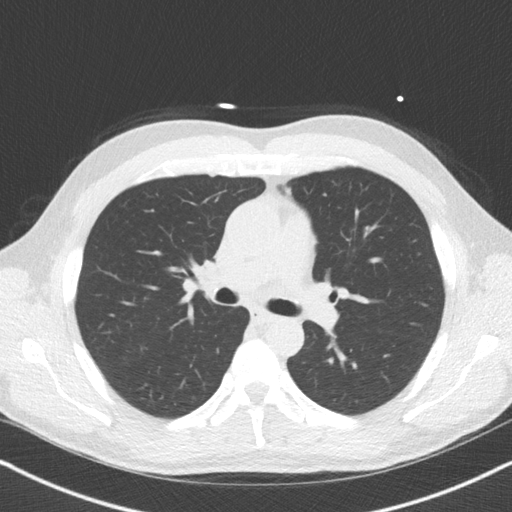

[16 of 20 positions shown; findings below may reference images not displayed]

FINDINGS: Non-cardiac: See separate report from [REDACTED].

Ascending Aorta:  Normal size.  No calcifications.

Pericardium: Normal.

Coronary arteries:  Normal origin.
IMPRESSION: Coronary calcium score of 0. This was 0 percentile for age and sex
matched control.

Kat-Kat Dass

EXAM:
OVER-READ INTERPRETATION  CT CHEST

The following report is an over-read performed by radiologist Dr.
Maitirelo Force [REDACTED] on 07/28/2017. This over-read
does not include interpretation of cardiac or coronary anatomy or
pathology. The coronary calcium score interpretation by the
cardiologist is attached.
FINDINGS: Cardiovascular: Heart is normal size. Visualized aorta is normal
caliber.

Mediastinum/Nodes: No adenopathy in the lower mediastinum or hila.

Lungs/Pleura: Visualized lungs are clear.  No effusions.

Upper Abdomen: Imaging into the upper abdomen shows no acute
findings.

Musculoskeletal: Chest wall soft tissues are unremarkable. No acute
bony abnormality.
IMPRESSION: No acute or significant extracardiac abnormality.

## 2018-08-15 DIAGNOSIS — R82998 Other abnormal findings in urine: Secondary | ICD-10-CM | POA: Diagnosis not present

## 2018-08-15 DIAGNOSIS — Z0000 Encounter for general adult medical examination without abnormal findings: Secondary | ICD-10-CM | POA: Diagnosis not present

## 2018-08-15 DIAGNOSIS — Z125 Encounter for screening for malignant neoplasm of prostate: Secondary | ICD-10-CM | POA: Diagnosis not present

## 2018-08-15 DIAGNOSIS — E559 Vitamin D deficiency, unspecified: Secondary | ICD-10-CM | POA: Diagnosis not present

## 2018-08-16 ENCOUNTER — Encounter: Payer: Self-pay | Admitting: Internal Medicine

## 2018-08-17 DIAGNOSIS — H25013 Cortical age-related cataract, bilateral: Secondary | ICD-10-CM | POA: Diagnosis not present

## 2018-08-17 DIAGNOSIS — H524 Presbyopia: Secondary | ICD-10-CM | POA: Diagnosis not present

## 2018-08-22 DIAGNOSIS — Z1389 Encounter for screening for other disorder: Secondary | ICD-10-CM | POA: Diagnosis not present

## 2018-08-22 DIAGNOSIS — E559 Vitamin D deficiency, unspecified: Secondary | ICD-10-CM | POA: Diagnosis not present

## 2018-08-22 DIAGNOSIS — Z0000 Encounter for general adult medical examination without abnormal findings: Secondary | ICD-10-CM | POA: Diagnosis not present

## 2018-08-22 DIAGNOSIS — E7849 Other hyperlipidemia: Secondary | ICD-10-CM | POA: Diagnosis not present

## 2018-08-22 DIAGNOSIS — N401 Enlarged prostate with lower urinary tract symptoms: Secondary | ICD-10-CM | POA: Diagnosis not present

## 2018-08-22 DIAGNOSIS — Z125 Encounter for screening for malignant neoplasm of prostate: Secondary | ICD-10-CM | POA: Diagnosis not present

## 2018-11-27 ENCOUNTER — Ambulatory Visit: Payer: Commercial Managed Care - PPO | Admitting: Internal Medicine

## 2018-12-04 ENCOUNTER — Ambulatory Visit (INDEPENDENT_AMBULATORY_CARE_PROVIDER_SITE_OTHER): Payer: Commercial Managed Care - PPO | Admitting: Internal Medicine

## 2018-12-04 ENCOUNTER — Encounter: Payer: Self-pay | Admitting: Internal Medicine

## 2018-12-04 VITALS — BP 104/70 | HR 55 | Ht 67.0 in | Wt 172.6 lb

## 2018-12-04 DIAGNOSIS — I480 Paroxysmal atrial fibrillation: Secondary | ICD-10-CM

## 2018-12-04 NOTE — Patient Instructions (Signed)

## 2018-12-04 NOTE — Progress Notes (Signed)
   PCP: Alysia Penna, MD Primary Cardiologist: Dr Gala Romney Primary EP: Dr Gena Fray is a 63 y.o. male who presents today for routine electrophysiology followup.  Since last being seen in our clinic, the patient reports doing very well.  Today, he denies symptoms of palpitations, chest pain, shortness of breath,  lower extremity edema, dizziness, presyncope, or syncope.  The patient is otherwise without complaint today.   Past Medical History:  Diagnosis Date  . Difficult intubation    has "crooked trachea"  . Muscle strain of right thigh   . Vitamin D deficiency    Past Surgical History:  Procedure Laterality Date  . ATRIAL FIBRILLATION ABLATION N/A 11/22/2017   Procedure: ATRIAL FIBRILLATION ABLATION;  Surgeon: Hillis Range, MD;  [facility];  Service: Cardiovascular;  Laterality: N/A;  . HAGLAND'S DEFORMITY EXCISION  09/2007, 09/2010  . TEE WITHOUT CARDIOVERSION N/A 11/22/2017   Procedure: TRANSESOPHAGEAL ECHOCARDIOGRAM (TEE);  Surgeon: Wendall Stade, MD;  [facility] ENDOSCOPY;  Service: Cardiovascular;  Laterality: N/A;    ROS- all systems are reviewed and negatives except as per HPI above  Current Outpatient Medications  Medication Sig Dispense Refill  . cholecalciferol (VITAMIN D) 1000 units tablet Take 1,000 Units by mouth daily.    . Multiple Vitamin (MULTI-VITAMINS) TABS Take 1 tablet by mouth daily.     . Polyvinyl Alcohol-Povidone (REFRESH OP) Place 1-2 drops into both eyes daily as needed (for dry eyes).      No current facility-administered medications for this visit.     Physical Exam: Vitals:   12/04/18 1003  BP: 104/70  Pulse: (!) 55  SpO2: 98%  Weight: 172 lb 9.6 oz (78.3 kg)  Height: 5\' 7"  (1.702 m)    GEN- The patient is well appearing, alert and oriented x 3 today.   Head- normocephalic, atraumatic Eyes-  Sclera clear, conjunctiva pink Ears- hearing intact Oropharynx- clear Lungs- Clear to ausculation bilaterally,  normal work of breathing Heart- Regular rate and rhythm, no murmurs, rubs or gallops, PMI not laterally displaced GI- soft, NT, ND, + BS Extremities- no clubbing, cyanosis, or edema  Wt Readings from Last 3 Encounters:  12/04/18 172 lb 9.6 oz (78.3 kg)  05/29/18 170 lb (77.1 kg)  02/27/18 172 lb (78 kg)    EKG tracing ordered today is personally reviewed and shows sinus bradycardia 55 bpm, otherwise normal ekg  Assessment and Plan:  1. Paroxysmal atrial fibrillation He has done great post ablation off AAD therapy chads2vasc score is 0 No changes  Return in a year  Hillis Range MD, [facility] 12/04/2018 10:20 AM

## 2019-12-05 ENCOUNTER — Other Ambulatory Visit: Payer: Self-pay

## 2019-12-05 ENCOUNTER — Encounter: Payer: Self-pay | Admitting: Internal Medicine

## 2019-12-05 ENCOUNTER — Telehealth (INDEPENDENT_AMBULATORY_CARE_PROVIDER_SITE_OTHER): Admitting: Internal Medicine

## 2019-12-05 VITALS — Ht 67.0 in | Wt 158.0 lb

## 2019-12-05 DIAGNOSIS — I480 Paroxysmal atrial fibrillation: Secondary | ICD-10-CM | POA: Diagnosis not present

## 2019-12-05 NOTE — Progress Notes (Signed)
Electrophysiology TeleHealth Note  Due to national recommendations of social distancing due to Erick 19, an audio telehealth visit is felt to be most appropriate for this patient at this time.  Verbal consent was obtained by me for the telehealth visit today.  The patient does not have capability for a virtual visit.  A phone visit is therefore required today.   Date:  12/05/2019   ID:  Bryan Stevens, DOB 02-09-1956, MRN [id number]  Location: patient's home  Provider location:  [place]  Evaluation Performed: Follow-up visit  PCP:  Velna Hatchet, MD   Electrophysiologist:  Dr Rayann Heman  Chief Complaint:  palpitations  History of Present Illness:    Bryan Stevens is a 64 y.o. male who presents via telehealth conferencing today.  Since last being seen in our clinic, the patient reports doing very well.  Today, he denies symptoms of palpitations, chest pain, shortness of breath,  lower extremity edema, dizziness, presyncope, or syncope.  The patient is otherwise without complaint today.  The patient denies symptoms of fevers, chills, cough, or new SOB worrisome for COVID 19.  Past Medical History:  Diagnosis Date  . Difficult intubation    has "crooked trachea"  . Muscle strain of right thigh   . Vitamin D deficiency     Past Surgical History:  Procedure Laterality Date  . ATRIAL FIBRILLATION ABLATION N/A 11/22/2017   Procedure: ATRIAL FIBRILLATION ABLATION;  Surgeon: Thompson Grayer, MD;  Location: [facility];  Service: Cardiovascular;  Laterality: N/A;  . HAGLAND'S DEFORMITY EXCISION  09/2007, 09/2010  . TEE WITHOUT CARDIOVERSION N/A 11/22/2017   Procedure: TRANSESOPHAGEAL ECHOCARDIOGRAM (TEE);  Surgeon: Josue Hector, MD;  Location: [facility] ENDOSCOPY;  Service: Cardiovascular;  Laterality: N/A;    Current Outpatient Medications  Medication Sig Dispense Refill  . cholecalciferol (VITAMIN D) 1000 units tablet Take 1,000 Units by mouth daily.    . Multiple Vitamin  (MULTI-VITAMINS) TABS Take 1 tablet by mouth daily.     . Polyvinyl Alcohol-Povidone (REFRESH OP) Place 1-2 drops into both eyes daily as needed (for dry eyes).      No current facility-administered medications for this visit.    Allergies:   Gramineae pollens   Social History:  The patient  reports that he has never smoked. He has never used smokeless tobacco. He reports current alcohol use. He reports that he does not use drugs.   Family History:  The patient's family history includes Alzheimer's disease in his mother; Colon cancer in his maternal grandfather; Pneumonia in his paternal grandmother; Rheumatic fever in his paternal grandfather; Stroke in his father and maternal grandmother.   ROS:  Please see the history of present illness.   All other systems are personally reviewed and negative.    Exam:    Vital Signs:  Ht 5\' 7"  (1.702 m)   Wt 158 lb (71.7 kg)   BMI 24.75 kg/m   Well sounding, alert and conversant   Labs/Other Tests and Data Reviewed:    Recent Labs: No results found for requested labs within last 8760 hours.   Wt Readings from Last 3 Encounters:  12/05/19 158 lb (71.7 kg)  12/04/18 172 lb 9.6 oz (78.3 kg)  05/29/18 170 lb (77.1 kg)     ASSESSMENT & PLAN:    1.  paroxsymal atrial fibrillation Doing well post ablation without recurrence chads2vasc score is 0.  He does not require [place] at that time.   Follow-up:  with me in a year  Patient Risk:  after full review of this patients clinical status, I feel that they are at moderate risk at this time.  Today, I have spent 15 minutes with the patient with telehealth technology discussing arrhythmia management .    Randolm Idol, MD  12/05/2019 9:00 AM     [facility] [address] [place] [address] [phone] (office) [phone] (fax)

## 2020-01-31 ENCOUNTER — Ambulatory Visit: Attending: Internal Medicine

## 2020-01-31 DIAGNOSIS — Z23 Encounter for immunization: Secondary | ICD-10-CM

## 2020-01-31 NOTE — Progress Notes (Signed)
   Covid-19 Vaccination Clinic  Name:  Bryan Stevens    MRN: [id number] DOB: 17-Aug-1956  01/31/2020  Mr. Millay was observed post Covid-19 immunization for 15 minutes without incident. He was provided with Vaccine Information Sheet and instruction to access the V-Safe system.   Mr. Lamphier was instructed to call 911 with any severe reactions post vaccine: Marland Kitchen Difficulty breathing  . Swelling of face and throat  . A fast heartbeat  . A bad rash all over body  . Dizziness and weakness   Immunizations Administered    Name Date Dose VIS Date Route   Pfizer COVID-19 Vaccine 01/31/2020  9:27 AM 0.3 mL 10/12/2019 Intramuscular   Manufacturer: [employer], [employer]   Lot: [id number]   NDC: [address]-8

## 2020-02-25 ENCOUNTER — Ambulatory Visit: Attending: Internal Medicine

## 2020-02-25 DIAGNOSIS — Z23 Encounter for immunization: Secondary | ICD-10-CM

## 2020-02-25 NOTE — Progress Notes (Signed)
   Covid-19 Vaccination Clinic  Name:  Orion Mole    MRN: [id number] DOB: [birth year]  02/25/2020  Mr. Kalp was observed post Covid-19 immunization for 15 minutes without incident. He was provided with Vaccine Information Sheet and instruction to access the V-Safe system.   Mr. Malveaux was instructed to call 911 with any severe reactions post vaccine: Marland Kitchen Difficulty breathing  . Swelling of face and throat  . A fast heartbeat  . A bad rash all over body  . Dizziness and weakness   Immunizations Administered    Name Date Dose VIS Date Route   Pfizer COVID-19 Vaccine 02/25/2020  8:42 AM 0.3 mL 12/26/2018 Intramuscular   Manufacturer: [employer], [employer]   Lot: [id number]   NDC: [address]-7

## 2020-03-21 ENCOUNTER — Other Ambulatory Visit: Payer: Self-pay | Admitting: Internal Medicine

## 2020-03-21 DIAGNOSIS — E785 Hyperlipidemia, unspecified: Secondary | ICD-10-CM

## 2020-04-07 ENCOUNTER — Ambulatory Visit
Admission: RE | Admit: 2020-04-07 | Discharge: 2020-04-07 | Disposition: A | Discharge status: Home / Self Care | Source: Ambulatory Visit | Attending: Internal Medicine | Admitting: Internal Medicine

## 2020-04-07 DIAGNOSIS — E785 Hyperlipidemia, unspecified: Secondary | ICD-10-CM

## 2020-05-01 HISTORY — PX: KNEE SURGERY: SHX244

## 2020-12-11 ENCOUNTER — Other Ambulatory Visit: Payer: Self-pay

## 2020-12-11 ENCOUNTER — Ambulatory Visit (INDEPENDENT_AMBULATORY_CARE_PROVIDER_SITE_OTHER): Admitting: Internal Medicine

## 2020-12-11 ENCOUNTER — Encounter: Payer: Self-pay | Admitting: Internal Medicine

## 2020-12-11 VITALS — BP 96/70 | HR 67 | Ht 67.0 in | Wt 170.0 lb

## 2020-12-11 DIAGNOSIS — I480 Paroxysmal atrial fibrillation: Secondary | ICD-10-CM | POA: Diagnosis not present

## 2020-12-11 NOTE — Progress Notes (Signed)
   PCP: Alysia Penna, MD   Primary EP: Dr Gena Fray is a 65 y.o. male who presents today for routine electrophysiology followup.  Since last being seen in our clinic, the patient reports doing very well.  Today, he denies symptoms of palpitations, chest pain, shortness of breath,  lower extremity edema, dizziness, presyncope, or syncope.  The patient is otherwise without complaint today.   Past Medical History:  Diagnosis Date  . Difficult intubation    has "crooked trachea"  . Muscle strain of right thigh   . Vitamin D deficiency    Past Surgical History:  Procedure Laterality Date  . ATRIAL FIBRILLATION ABLATION N/A 11/22/2017   Procedure: ATRIAL FIBRILLATION ABLATION;  Surgeon: Hillis Range, MD;  Location: [facility];  Service: Cardiovascular;  Laterality: N/A;  . HAGLAND'S DEFORMITY EXCISION  09/2007, 09/2010  . TEE WITHOUT CARDIOVERSION N/A 11/22/2017   Procedure: TRANSESOPHAGEAL ECHOCARDIOGRAM (TEE);  Surgeon: Wendall Stade, MD;  Location: [facility] ENDOSCOPY;  Service: Cardiovascular;  Laterality: N/A;    ROS- all systems are reviewed and negatives except as per HPI above  Current Outpatient Medications  Medication Sig Dispense Refill  . cholecalciferol (VITAMIN D) 1000 units tablet Take 1,000 Units by mouth daily.    . Multiple Vitamin (MULTI-VITAMINS) TABS Take 1 tablet by mouth daily.     . Polyvinyl Alcohol-Povidone (REFRESH OP) Place 1-2 drops into both eyes daily as needed (for dry eyes).     . rosuvastatin (CRESTOR) 5 MG tablet Take 2.5 mg by mouth every other day. Monday,Wednesday and Friday     No current facility-administered medications for this visit.    Physical Exam: Vitals:   12/11/20 1551  BP: 96/70  Pulse: 67  SpO2: 97%  Weight: 170 lb (77.1 kg)  Height: 5\' 7"  (1.702 m)    GEN- The patient is well appearing, alert and oriented x 3 today.   Head- normocephalic, atraumatic Eyes-  Sclera clear, conjunctiva pink Ears- hearing  intact Oropharynx- clear Lungs- Clear to ausculation bilaterally, normal work of breathing Heart- Regular rate and rhythm, no murmurs, rubs or gallops, PMI not laterally displaced GI- soft, NT, ND, + BS Extremities- no clubbing, cyanosis, or edema  Wt Readings from Last 3 Encounters:  12/11/20 170 lb (77.1 kg)  12/05/19 158 lb (71.7 kg)  12/04/18 172 lb 9.6 oz (78.3 kg)    EKG tracing ordered today is personally reviewed and shows sinus  Assessment and Plan:  1. Paroxysmal atrial fibrillation Doing very well post ablation  chads2vasc score is 0  2. Belching/ indigestion with activity Denies chest pain Cardiac CT/ prior calcium score reviewed No changes  Risks, benefits and potential toxicities for medications prescribed and/or refilled reviewed with patient today.   Return in 1 year  02/02/19 MD, [facility] 12/11/2020 4:23 PM

## 2020-12-11 NOTE — Patient Instructions (Addendum)
Medication Instructions:  Your physician recommends that you continue on your current medications as directed. Please refer to the Current Medication list given to you today.  Labwork: None ordered.  Testing/Procedures: None ordered.  Follow-Up: Your physician wants you to follow-up in: 12/07/21 at 9:30 am with Hillis Range, MD    Any Other Special Instructions Will Be Listed Below (If Applicable).  If you need a refill on your cardiac medications before your next appointment, please call your pharmacy.

## 2021-12-07 ENCOUNTER — Ambulatory Visit: Admitting: Internal Medicine

## 2021-12-18 ENCOUNTER — Encounter: Payer: Self-pay | Admitting: Internal Medicine

## 2021-12-18 ENCOUNTER — Ambulatory Visit (INDEPENDENT_AMBULATORY_CARE_PROVIDER_SITE_OTHER): Admitting: Internal Medicine

## 2021-12-18 ENCOUNTER — Other Ambulatory Visit: Payer: Self-pay

## 2021-12-18 VITALS — BP 120/76 | HR 62 | Ht 67.5 in | Wt 167.2 lb

## 2021-12-18 DIAGNOSIS — I480 Paroxysmal atrial fibrillation: Secondary | ICD-10-CM

## 2021-12-18 NOTE — Progress Notes (Signed)
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 male who presents today for routine electrophysiology followup.  Since last being seen in our clinic, the patient reports doing very well.  Today, he denies symptoms of palpitations, chest pain, shortness of breath,  lower extremity edema, dizziness, presyncope, or syncope.  The patient is otherwise without complaint today.   Past Medical History:  Diagnosis Date   Difficult intubation    has "crooked trachea"   Muscle strain of right thigh    Vitamin D deficiency    Past Surgical History:  Procedure Laterality Date   ATRIAL FIBRILLATION ABLATION N/A 11/22/2017   Procedure: ATRIAL FIBRILLATION ABLATION;  Surgeon: Hillis Range, MD;  [facility]: [facility];  Service: Cardiovascular;  Laterality: N/A;   HAGLAND'S DEFORMITY EXCISION  09/2007, 09/2010   TEE WITHOUT CARDIOVERSION N/A 11/22/2017   Procedure: TRANSESOPHAGEAL ECHOCARDIOGRAM (TEE);  Surgeon: Wendall Stade, MD;  Location: [facility] ENDOSCOPY;  Service: Cardiovascular;  Laterality: N/A;    ROS- all systems are reviewed and negatives except as per HPI above  Current Outpatient Medications  Medication Sig Dispense Refill   aspirin 81 MG EC tablet Pt taking 1 tablet by mouth a couple days a week     cholecalciferol (VITAMIN D) 1000 units tablet Take 1,000 Units by mouth daily.     Multiple Vitamin (MULTI-VITAMINS) TABS Take 1 tablet by mouth daily.      Polyvinyl Alcohol-Povidone (REFRESH OP) Place 1-2 drops into both eyes daily as needed (for dry eyes).      rosuvastatin (CRESTOR) 5 MG tablet Take 2.5 mg by mouth every other day. Monday,Wednesday and Friday     No current facility-administered medications for this visit.    Physical Exam: Vitals:   12/18/21 1533  BP: 120/76  Pulse: 62  SpO2: 95%  Weight: 167 lb 3.2 oz (75.8 kg)  Height: 5' 7.5" (1.715 m)    GEN- The patient is well appearing, alert and oriented x 3 today.   Head-  normocephalic, atraumatic Eyes-  Sclera clear, conjunctiva pink Ears- hearing intact Oropharynx- clear Lungs- Clear to ausculation bilaterally, normal work of breathing Heart- Regular rate and rhythm, no murmurs, rubs or gallops, PMI not laterally displaced GI- soft, NT, ND, + BS Extremities- no clubbing, cyanosis, or edema  Wt Readings from Last 3 Encounters:  12/18/21 167 lb 3.2 oz (75.8 kg)  12/11/20 170 lb (77.1 kg)  12/05/19 158 lb (71.7 kg)    EKG tracing ordered today is personally reviewed and shows sinus  Assessment and Plan:  Paroxysmal atrial fibrillation Resolved post ablation Chads2vasc score is 0 No changes  Return in 2 years  Hillis Range MD, [facility] 12/18/2021 3:45 PM

## 2021-12-18 NOTE — Patient Instructions (Addendum)
Medication Instructions:  Your physician recommends that you continue on your current medications as directed. Please refer to the Current Medication list given to you today.  Labwork: None ordered.  Testing/Procedures: None ordered.  Follow-Up: Your physician wants you to follow-up in: 2 years with Dr. Jacquiline Doe will receive a reminder letter in the mail two months in advance. If you don't receive a letter, please call our office to schedule the follow-up appointment.  Afib clinic-[phone]   Any Other Special Instructions Will Be Listed Below (If Applicable).  If you need a refill on your cardiac medications before your next appointment, please call your pharmacy.

## 2022-01-12 ENCOUNTER — Encounter: Payer: Self-pay | Admitting: Gastroenterology

## 2022-01-26 ENCOUNTER — Telehealth: Payer: Self-pay

## 2022-01-26 NOTE — Telephone Encounter (Signed)
John, ?Please review patient's chart as it is documented that patient has a "crooked trachea"; ? ?Please advise if patient needs to be rescheduled for hospital procedure instead of [facility] ? ? ?Thank you ?

## 2022-01-29 NOTE — Telephone Encounter (Signed)
Dr Candie Mile pt has a difficult airway. Scheduled for a direct colon with you on 5/1. Would you like an OV or direct to [facility]? ? ?Thank you! ? ?Misty Stanley PV ?

## 2022-02-08 ENCOUNTER — Encounter

## 2022-02-22 ENCOUNTER — Ambulatory Visit (INDEPENDENT_AMBULATORY_CARE_PROVIDER_SITE_OTHER): Admitting: Gastroenterology

## 2022-02-22 ENCOUNTER — Encounter: Payer: Self-pay | Admitting: Gastroenterology

## 2022-02-22 VITALS — BP 116/62 | HR 66 | Ht 67.5 in | Wt 163.0 lb

## 2022-02-22 DIAGNOSIS — Z1211 Encounter for screening for malignant neoplasm of colon: Secondary | ICD-10-CM | POA: Diagnosis not present

## 2022-02-22 NOTE — Patient Instructions (Signed)
If you are age 64 or younger, your body mass index should be between 19-25. Your Body mass index is 25.15 kg/m?Marland Kitchen If this is out of the aformentioned range listed, please consider follow up with your Primary Care Provider.  ?________________________________________________________ ? ?The [place] [facility] providers would like to encourage you to use [facility] to communicate with providers for non-urgent requests or questions.  Due to long hold times on the telephone, sending your provider a message by [facility] may be a faster and more efficient way to get a response.  Please allow 48 business hours for a response.  Please remember that this is for non-urgent requests.  ?_______________________________________________________ ? ?We will contact you to schedule a colonoscopy at [facility] in July 2023. ? ?Thank you for entrusting me with your care and choosing [facility]. ? ?Dr Christella Hartigan ? ? ?

## 2022-02-22 NOTE — Progress Notes (Signed)
? ?HPI: ?This is a very pleasant 66 year old man who was referred to me by Alysia Penna, MD  to evaluate routine risk for colon cancer, history of difficult airway intubation.   ? ?He had atrial fibrillation which resolved post ablation.  He is not currently on blood thinners. ? 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 ?He had a surgery on his foot in 2008 or so and he was told that he probably had a crooked trachea.  He is always told other providers that he was a difficult airway intubation because of that.  This has never been definitively confirmed and he has had subsequent general anesthesia. ? ?He has no troubles with his bowels. ? ?He is a long-distance runner, has run multiple marathons including this years [address]  Usually after the first 3 to 4 miles running he will have to find a bathroom to relieve his bowels. ? ?He does not see blood in his stool. ? ?His grandfather had colon cancer in his 69s ? 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 ? ? ?Review of systems: ?Pertinent positive and negative review of systems were noted in the above HPI section. All other review negative. ? ? ?Past Medical History:  ?Diagnosis Date  ? Difficult intubation   ? has "crooked trachea"  ? Muscle strain of right thigh   ? Vitamin D deficiency   ? ? ?Past Surgical History:  ?Procedure Laterality Date  ? ATRIAL FIBRILLATION ABLATION N/A 11/22/2017  ? Procedure: ATRIAL FIBRILLATION ABLATION;  Surgeon: Hillis Range, MD;  Location: [facility];  Service: Cardiovascular;  Laterality: N/A;  ? HAGLAND'S DEFORMITY EXCISION  09/2007, 09/2010  ? TEE WITHOUT CARDIOVERSION N/A 11/22/2017  ? Procedure: TRANSESOPHAGEAL ECHOCARDIOGRAM (TEE);  Surgeon: Wendall Stade, MD;  Location: [facility] ENDOSCOPY;  Service: Cardiovascular;  Laterality: N/A;  ? ? ?Current Outpatient Medications   ?Medication Instructions  ? aspirin 81 MG EC tablet Pt taking 1 tablet by mouth a couple days a week  ? cholecalciferol (VITAMIN D) 1,000 Units, Oral, Daily  ? Multiple Vitamin (MULTI-VITAMINS) TABS 1 tablet, Oral, Daily  ? Polyvinyl Alcohol-Povidone (REFRESH OP) 1-2 drops, Both Eyes, Daily PRN  ? rosuvastatin (CRESTOR) 2.5 mg, Oral, Every other day, Monday,Wednesday and Friday  ? ? ?Allergies as of 02/22/2022 - Review Complete 02/22/2022  ?Allergen Reaction Noted  ? Gramineae pollens Other (See Comments) 01/30/2016  ? ? ?Family History  ?Problem Relation Age of Onset  ? Alzheimer's disease Mother   ? Stroke Father   ? Stroke Maternal Grandmother   ? Colon cancer Maternal Grandfather   ? Pneumonia Paternal Grandmother   ? Rheumatic fever Paternal Grandfather   ? ? ?Social History  ? ?Socioeconomic History  ? Marital status: Married  ?  Spouse name: Not on file  ? Number of children: 3  ? Years of education: BA, BS, MS, MA  ? Highest education level: Not on file  ?Occupational History  ? Occupation: Agricultural engineer  ?Tobacco Use  ? Smoking status: Never  ?  Passive exposure: Never  ? Smokeless tobacco: Never  ?Vaping Use  ? Vaping Use: Never used  ?Substance and Sexual Activity  ? Alcohol use: Yes  ?  Comment: 12 oz/mo  ? Drug use: No  ? Sexual activity: Not on file  ?  Comment: Married  ?Other Topics Concern  ?  Not on file  ?Social History Narrative  ? Lives at home w/ his wife and children  ? Right-handed  ? Caffeine: 16 oz per month  ? ?Social Determinants of Health  ? ?Financial Resource Strain: Not on file  ?Food Insecurity: Not on file  ?Transportation Needs: Not on file  ?Physical Activity: Not on file  ?Stress: Not on file  ?Social Connections: Not on file  ?Intimate Partner Violence: Not on file  ? ? ? ?Physical Exam: ?BP 116/62   Pulse 66   Ht 5' 7.5" (1.715 m)   Wt 163 lb (73.9 kg)   SpO2 98%   BMI 25.15 kg/m?  ?Constitutional: generally well-appearing ?Psychiatric: alert and oriented x3 ?Eyes:  extraocular movements intact ?Mouth: oral pharynx moist, no lesions ?Neck: supple no lymphadenopathy ?Cardiovascular: heart regular rate and rhythm ?Lungs: clear to auscultation bilaterally ?Abdomen: soft, nontender, nondistended, no obvious ascites, no peritoneal signs, normal bowel sounds ?Extremities: no lower extremity edema bilaterally ?Skin: no lesions on visible extremities ? ? ?Assessment and plan: ?66 y.o. male with routine risk for colon cancer, history of difficult airway intubation, "crooked trachea" ? ?First we discussed colon cancer screening options.  Colonoscopy versus Cologuard stool testing.  He decided he would like to go ahead with traditional colonoscopy instead of Cologuard. ? ?Given his previously documented crooked trachea I explained it would be safest that we proceed with this in the hospital outpatient setting rather than in our ambulatory surgical center.  He understands and agrees. ? ?This will probably be in July given the critical staffing shortages in the hospital endoscopy departments but certainly there is no emergency, this is routine colon cancer screening. ? ?I see no reason for blood tests or imaging studies prior to then. ? ?Please see the "Patient Instructions" section for addition details about the plan. ? ? ?Rob Bunting, MD ?[facility] ?02/22/2022, 1:14 PM ? ?Cc: Alysia Penna, MD ? ?Total time on date of encounter was 40  minutes (this included time spent preparing to see the patient reviewing records; obtaining and/or reviewing separately obtained history; performing a medically appropriate exam and/or evaluation; counseling and educating the patient and family if present; ordering medications, tests or procedures if applicable; and documenting clinical information in the health record). ? ? ?

## 2022-03-01 ENCOUNTER — Other Ambulatory Visit: Payer: Self-pay

## 2022-03-01 ENCOUNTER — Encounter: Admitting: Gastroenterology

## 2022-03-01 DIAGNOSIS — Z1211 Encounter for screening for malignant neoplasm of colon: Secondary | ICD-10-CM

## 2022-05-13 ENCOUNTER — Encounter (HOSPITAL_COMMUNITY): Payer: Self-pay | Admitting: Gastroenterology

## 2022-05-13 NOTE — Progress Notes (Signed)
Attempted to obtain medical history via telephone, unable to reach at this time. HIPAA compliant voicemail message left requesting return call to pre surgical testing department. 

## 2022-05-19 ENCOUNTER — Telehealth: Payer: Self-pay | Admitting: Gastroenterology

## 2022-05-19 NOTE — Telephone Encounter (Signed)
The pt appt has been cancelled per pt request.  He would like to call back to get rescheduled in November.

## 2022-05-19 NOTE — Telephone Encounter (Signed)
Patient called to cancel his hospital procedure with Dr. Christella Hartigan for tomorrow.  He is "stuck" in [place] and flights have been cancelled there due to weather conditions.  He said he would probably not even make it back home till possibly early tomorrow morning.  He said he was very sorry and that he would like to reschedule this procedure.  Thank you.

## 2022-05-20 ENCOUNTER — Ambulatory Visit (HOSPITAL_COMMUNITY): Admission: RE | Admit: 2022-05-20 | Source: Ambulatory Visit | Admitting: Gastroenterology

## 2022-05-20 SURGERY — COLONOSCOPY WITH PROPOFOL
Anesthesia: Monitor Anesthesia Care

## 2022-07-29 ENCOUNTER — Encounter: Payer: Self-pay | Admitting: Physician Assistant

## 2022-07-29 ENCOUNTER — Ambulatory Visit: Admitting: Physician Assistant

## 2022-07-29 VITALS — BP 100/58 | Ht 67.5 in | Wt 165.2 lb

## 2022-07-29 DIAGNOSIS — T884XXD Failed or difficult intubation, subsequent encounter: Secondary | ICD-10-CM

## 2022-07-29 DIAGNOSIS — Z1211 Encounter for screening for malignant neoplasm of colon: Secondary | ICD-10-CM | POA: Diagnosis not present

## 2022-07-29 MED ORDER — NA SULFATE-K SULFATE-MG SULF 17.5-3.13-1.6 GM/177ML PO SOLN: 1.0000 | Freq: Once | ORAL | 0 refills | Status: DC

## 2022-07-29 NOTE — Patient Instructions (Signed)
You have been scheduled for a colonoscopy. Please follow written instructions given to you at your visit today.  Please pick up your prep supplies at the pharmacy within the next 1-3 days. If you use inhalers (even only as needed), please bring them with you on the day of your procedure.  _______________________________________________________  If you are age 64 or older, your body mass index should be between 23-30. Your Body mass index is 25.49 kg/m. If this is out of the aforementioned range listed, please consider follow up with your Primary Care Provider.  If you are age 33 or younger, your body mass index should be between 19-25. Your Body mass index is 25.49 kg/m. If this is out of the aformentioned range listed, please consider follow up with your Primary Care Provider.   ________________________________________________________  The [facility] providers would like to encourage you to use [facility] to communicate with providers for non-urgent requests or questions.  Due to long hold times on the telephone, sending your provider a message by [facility] may be a faster and more efficient way to get a response.  Please allow 48 business hours for a response.  Please remember that this is for non-urgent requests.  _______________________________________________________

## 2022-07-29 NOTE — Progress Notes (Signed)
Chief Complaint: Difficult airway, screening for colon cancer  HPI:    Mr. Bryan Stevens is a 66 year old Caucasian male with a past medical history of A-fib resolved post ablation and difficult airway, previously known to Dr. Ardis Hughs, who presents to clinic today to discuss a screening colonoscopy.    02/22/2022 patient seen in clinic by Dr. Ardis Hughs to discuss a screening colonoscopy.  At that time discussed that he had had surgery in 2008 and was told that he was a difficult intubation because he had a "crooked trachea".  He was not having any troubles at that time.  He was set up for a colonoscopy at the hospital given difficult intubation.  Unfortunately he had to cancel this on 05/20/2022 as he was stuck out of town.    Today, patient returns to clinic and tells me he is not having any problems.  He just needs to get his colonoscopy reset up at the hospital.    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    Denies fever, chills, weight loss or blood in his stool.  Past Medical History:  Diagnosis Date   Atrial fibrillation ([place])    Difficult intubation    has "crooked trachea"   Hyperlipidemia    Muscle strain of right thigh    Vitamin D deficiency     Past Surgical History:  Procedure Laterality Date   ATRIAL FIBRILLATION ABLATION N/A 11/22/2017   Procedure: ATRIAL FIBRILLATION ABLATION;  Surgeon: Thompson Grayer, MD;  Location: [facility];  Service: Cardiovascular;  Laterality: N/A;   HAGLAND'S DEFORMITY EXCISION  09/2007, 09/2010   KNEE SURGERY     KNEE SURGERY Left 05/2020   TEE WITHOUT CARDIOVERSION N/A 11/22/2017   Procedure: TRANSESOPHAGEAL ECHOCARDIOGRAM (TEE);  Surgeon: Josue Hector, MD;  Location: [facility] ENDOSCOPY;  Service: Cardiovascular;  Laterality: N/A;    Current Outpatient Medications  Medication Sig Dispense Refill   aspirin 81 MG EC tablet Take 81 mg by mouth 2 (two) times a week.     cholecalciferol (VITAMIN D) 1000 units  tablet Take 1,000 Units by mouth daily.     Multiple Vitamin (MULTI-VITAMINS) TABS Take 1 tablet by mouth daily.      Polyvinyl Alcohol-Povidone (REFRESH OP) Place 1 drop into both eyes 2 (two) times a week.     rosuvastatin (CRESTOR) 5 MG tablet Take 2.5 mg by mouth every other day.     No current facility-administered medications for this visit.    Allergies as of 07/29/2022 - Review Complete 05/13/2022  Allergen Reaction Noted   Gramineae pollens Other (See Comments) 01/30/2016    Family History  Problem Relation Age of Onset   Alzheimer's disease Mother    Stroke Father    Heart disease Father    Stroke Maternal Grandmother    Colon cancer Maternal Grandfather    Pneumonia Paternal Grandmother    Rheumatic fever Paternal Grandfather     Social History   Socioeconomic History   Marital status: Married    Spouse name: Not on file   Number of children: 3   Years of education: BA, BS, MS, MA   Highest education level: Not on file  Occupational History   Occupation: Clinical research associate  Tobacco Use   Smoking status: Never    Passive exposure: Never   Smokeless tobacco: Never  Vaping Use   Vaping Use: Never used  Substance and Sexual Activity   Alcohol use: Yes    Comment: 12 oz/mo  Drug use: No   Sexual activity: Not on file    Comment: Married  Other Topics Concern   Not on file  Social History Narrative   Lives at home w/ his wife and children   Right-handed   Caffeine: 16 oz per month   Social Determinants of Health   Financial Resource Strain: Not on file  Food Insecurity: Not on file  Transportation Needs: Not on file  Physical Activity: Not on file  Stress: Not on file  Social Connections: Not on file  Intimate Partner Violence: Not on file    Review of Systems:    Constitutional: No weight loss, fever or chills Cardiovascular: No chest pain Respiratory: No SOB  Gastrointestinal: See HPI and otherwise negative   Physical Exam:  Vital  signs: BP (!) 100/58   Ht 5' 7.5" (1.715 m)   Wt 165 lb 3.2 oz (74.9 kg)   BMI 25.49 kg/m   Constitutional:   Pleasant Caucasian male appears to be in NAD, Well developed, Well nourished, alert and cooperative Respiratory: Respirations even and unlabored. Lungs clear to auscultation bilaterally.   No wheezes, crackles, or rhonchi.  Cardiovascular: Normal S1, S2. No MRG. Regular rate and rhythm. No peripheral edema, cyanosis or pallor.  Gastrointestinal:  Soft, nondistended, nontender. No rebound or guarding. Normal bowel sounds. No appreciable masses or hepatomegaly. Rectal:  Not performed.  Psychiatric: Oriented to person, place and time. Demonstrates good judgement and reason without abnormal affect or behaviors.  No recent labs.  Assessment: 1.  Screening for colon cancer 2.  Difficult intubation  Plan: 1.  Scheduled patient at the hospital with Dr. Silverio Decamp due to his difficult intubation for a screening colonoscopy.  Did provide the patient a detailed list of risks for the procedure and he agrees to proceed. 2.  Patient follow in clinic per recommendations after time of procedure.  Note sent to Dr. Silverio Decamp in Dr. Ardis Hughs absence.  Ellouise Newer, PA-C [facility] 07/29/2022, 9:23 AM  Cc: Velna Hatchet, MD

## 2022-10-01 ENCOUNTER — Other Ambulatory Visit: Payer: Self-pay

## 2022-10-01 ENCOUNTER — Telehealth: Payer: Self-pay | Admitting: Gastroenterology

## 2022-10-01 NOTE — Telephone Encounter (Signed)
Patient contacted and instructions reviewed. Written instructions available through My Chart as well. He has spoken with the pre-procedure nurse and understands to go to the Entrance A at [facility].

## 2022-10-01 NOTE — Telephone Encounter (Signed)
Patient is calling states his prep is from the October procedure that he rescheduled and is asking if he needs a new one for his procedure on Monday 12/04. Also is wanting to know where to go when he gets to [facility]. Please advise

## 2022-10-04 ENCOUNTER — Ambulatory Visit (HOSPITAL_BASED_OUTPATIENT_CLINIC_OR_DEPARTMENT_OTHER): Admitting: Anesthesiology

## 2022-10-04 ENCOUNTER — Encounter (HOSPITAL_COMMUNITY)
Admission: RE | Disposition: A | Discharge status: Home / Self Care | Payer: Self-pay | Source: Ambulatory Visit | Attending: Gastroenterology

## 2022-10-04 ENCOUNTER — Ambulatory Visit (HOSPITAL_COMMUNITY): Admitting: Anesthesiology

## 2022-10-04 ENCOUNTER — Other Ambulatory Visit: Payer: Self-pay

## 2022-10-04 ENCOUNTER — Ambulatory Visit (HOSPITAL_COMMUNITY)
Admission: RE | Admit: 2022-10-04 | Discharge: 2022-10-04 | Disposition: A | Discharge status: Home / Self Care | Source: Ambulatory Visit | Attending: Gastroenterology | Admitting: Gastroenterology

## 2022-10-04 ENCOUNTER — Encounter (HOSPITAL_COMMUNITY): Payer: Self-pay | Admitting: Gastroenterology

## 2022-10-04 DIAGNOSIS — I4891 Unspecified atrial fibrillation: Secondary | ICD-10-CM | POA: Diagnosis not present

## 2022-10-04 DIAGNOSIS — K648 Other hemorrhoids: Secondary | ICD-10-CM

## 2022-10-04 DIAGNOSIS — Z1211 Encounter for screening for malignant neoplasm of colon: Secondary | ICD-10-CM | POA: Diagnosis present

## 2022-10-04 DIAGNOSIS — M1990 Unspecified osteoarthritis, unspecified site: Secondary | ICD-10-CM

## 2022-10-04 DIAGNOSIS — T884XXD Failed or difficult intubation, subsequent encounter: Secondary | ICD-10-CM

## 2022-10-04 HISTORY — PX: COLONOSCOPY WITH PROPOFOL: SHX5780

## 2022-10-04 SURGERY — COLONOSCOPY WITH PROPOFOL
Anesthesia: Monitor Anesthesia Care

## 2022-10-04 MED ORDER — LIDOCAINE 2% (20 MG/ML) 5 ML SYRINGE
INTRAMUSCULAR | Status: DC | PRN
  Administered 2022-10-04: 50 mg via INTRAVENOUS

## 2022-10-04 MED ORDER — PROPOFOL 10 MG/ML IV BOLUS
INTRAVENOUS | Status: DC | PRN
  Administered 2022-10-04: 20 mg via INTRAVENOUS
  Administered 2022-10-04: 30 mg via INTRAVENOUS
  Administered 2022-10-04: 20 mg via INTRAVENOUS

## 2022-10-04 MED ORDER — PROPOFOL 500 MG/50ML IV EMUL
INTRAVENOUS | Status: DC | PRN
  Administered 2022-10-04: 150 ug/kg/min via INTRAVENOUS

## 2022-10-04 MED ORDER — LACTATED RINGERS IV SOLN: INTRAVENOUS | Status: DC | PRN

## 2022-10-04 MED ORDER — SODIUM CHLORIDE 0.9 % IV SOLN: INTRAVENOUS | Status: DC

## 2022-10-04 SURGICAL SUPPLY — 22 items

## 2022-10-04 NOTE — Op Note (Signed)
[facility] Patient Name: Bryan Stevens Procedure Date : 10/04/2022 MRN: [id number] Attending MD: Napoleon Form , MD, [id number] Date of Birth: [birth year] CSN: [id number] Age: 66 Admit Type: Outpatient Procedure:                Colonoscopy Indications:              Screening for colorectal malignant neoplasm Providers:                Napoleon Form, MD, Marja Kays,                            Technician, Fransisca Connors Referring MD:              Medicines:                Monitored Anesthesia Care Complications:            No immediate complications. Estimated Blood Loss:     Estimated blood loss: none. Procedure:                Pre-Anesthesia Assessment:                           - Prior to the procedure, a History and Physical                            was performed, and patient medications and                            allergies were reviewed. The patient's tolerance of                            previous anesthesia was also reviewed. The risks                            and benefits of the procedure and the sedation                            options and risks were discussed with the patient.                            All questions were answered, and informed consent                            was obtained. Prior Anticoagulants: The patient has                            taken no anticoagulant or antiplatelet agents. ASA                            Grade Assessment: II - A patient with mild systemic                            disease. After reviewing the risks and benefits,  the patient was deemed in satisfactory condition to                            undergo the procedure.                           After obtaining informed consent, the colonoscope                            was passed under direct vision. Throughout the                            procedure, the patient's blood pressure, pulse, and                             oxygen saturations were monitored continuously. The                            PCF-190TL ([id number]) Olympus colonoscope was                            introduced through the anus and advanced to the the                            cecum, identified by appendiceal orifice and                            ileocecal valve. The colonoscopy was performed                            without difficulty. The patient tolerated the                            procedure well. The quality of the bowel                            preparation was excellent. The ileocecal valve,                            appendiceal orifice, and rectum were photographed. Scope In: 7:54:04 AM Scope Out: 8:15:55 AM Scope Withdrawal Time: 0 hours 17 minutes 21 seconds  Total Procedure Duration: 0 hours 21 minutes 51 seconds  Findings:      The perianal and digital rectal examinations were normal.      Non-bleeding internal hemorrhoids were found during retroflexion. The       hemorrhoids were medium-sized.      The exam was otherwise without abnormality. Impression:               - Non-bleeding internal hemorrhoids.                           - The examination was otherwise normal.                           - No specimens collected. Recommendation:           -  Patient has a contact number available for                            emergencies. The signs and symptoms of potential                            delayed complications were discussed with the                            patient. Return to normal activities tomorrow.                            Written discharge instructions were provided to the                            patient.                           - Resume previous diet.                           - Continue present medications.                           - Repeat colonoscopy in 10 years for surveillance.                           - Return to GI clinic PRN. Procedure Code(s):        --- Professional ---                            S2831, Colorectal cancer screening; colonoscopy on                            individual not meeting criteria for high risk Diagnosis Code(s):        --- Professional ---                           Z12.11, Encounter for screening for malignant                            neoplasm of colon                           K64.8, Other hemorrhoids CPT copyright 2022 American Medical Association. All rights reserved. The codes documented in this report are preliminary and upon coder review may  be revised to meet current compliance requirements. Napoleon Form, MD 10/04/2022 8:33:40 AM This report has been signed electronically. Number of Addenda: 0

## 2022-10-04 NOTE — H&P (Signed)
[facility] History and Physical   Primary Care Physician:  Alysia Penna, MD   Reason for Procedure:  Colorectal cancer screening  Plan:    Screening colonoscopy with possible interventions as needed     HPI: Bryan Stevens is a very pleasant 66 y.o. male here for screening colonoscopy. Denies any nausea, vomiting, abdominal pain, melena or bright red blood per rectum  The risks and benefits as well as alternatives of endoscopic procedure(s) have been discussed and reviewed. All questions answered. The patient agrees to proceed.    Past Medical History:  Diagnosis Date   Atrial fibrillation ([facility])    Difficult intubation    has "crooked trachea"   Hyperlipidemia    Muscle strain of right thigh    Vitamin D deficiency     Past Surgical History:  Procedure Laterality Date   ATRIAL FIBRILLATION ABLATION N/A 11/22/2017   Procedure: ATRIAL FIBRILLATION ABLATION;  Surgeon: Hillis Range, MD;  Location: [facility] INVASIVE CV LAB;  Service: Cardiovascular;  Laterality: N/A;   HAGLAND'S DEFORMITY EXCISION  09/2007, 09/2010   KNEE SURGERY     KNEE SURGERY Left 05/2020   TEE WITHOUT CARDIOVERSION N/A 11/22/2017   Procedure: TRANSESOPHAGEAL ECHOCARDIOGRAM (TEE);  Surgeon: Wendall Stade, MD;  Location: [facility] ENDOSCOPY;  Service: Cardiovascular;  Laterality: N/A;    Prior to Admission medications   Medication Sig Start Date End Date Taking? Authorizing Provider  aspirin 81 MG EC tablet Take 81 mg by mouth 3 (three) times a week. 07/22/15  Yes [provider]  ibuprofen (ADVIL) 200 MG tablet Take 200-400 mg by mouth 2 (two) times daily as needed for moderate pain.   Yes [provider]  Multiple Vitamin (MULTI-VITAMINS) TABS Take 1 tablet by mouth daily.    Yes [provider]  Polyvinyl Alcohol-Povidone (REFRESH OP) Place 1 drop into both eyes daily.   Yes [provider]  rosuvastatin (CRESTOR) 5 MG tablet Take 2.5 mg by mouth daily. 09/22/20   Yes [provider]  VITAMIN D PO Take 1 capsule by mouth daily.   Yes [provider]    Current Facility-Administered Medications  Medication Dose Route Frequency Provider Last Rate Last Admin   0.9 %  sodium chloride infusion   Intravenous Continuous Unk Lightning, PA        Allergies as of 07/29/2022 - Review Complete 07/29/2022  Allergen Reaction Noted   Gramineae pollens Other (See Comments) 01/30/2016    Family History  Problem Relation Age of Onset   Alzheimer's disease Mother    Stroke Father    Heart disease Father    Stroke Maternal Grandmother    Colon cancer Maternal Grandfather    Pneumonia Paternal Grandmother    Rheumatic fever Paternal Grandfather     Social History   Socioeconomic History   Marital status: Married    Spouse name: Not on file   Number of children: 3   Years of education: BA, BS, MS, MA   Highest education level: Not on file  Occupational History   Occupation: Agricultural engineer  Tobacco Use   Smoking status: Never    Passive exposure: Never   Smokeless tobacco: Never  Vaping Use   Vaping Use: Never used  Substance and Sexual Activity   Alcohol use: Yes    Comment: 12 oz/mo   Drug use: No   Sexual activity: Not on file    Comment: Married  Other Topics Concern   Not on file  Social History Narrative  Lives at home w/ his wife and children   Right-handed   Caffeine: 16 oz per month   Social Determinants of Health   Financial Resource Strain: Not on file  Food Insecurity: Not on file  Transportation Needs: Not on file  Physical Activity: Not on file  Stress: Not on file  Social Connections: Not on file  Intimate Partner Violence: Not on file    Review of Systems:  All other review of systems negative except as mentioned in the HPI.  Physical Exam: Vital signs in last 24 hours: Blood Pressure 129/83   Pulse (Abnormal) 56   Respiration 11   Height 5' 7.5" (1.715 m)   Weight 71.7 kg    Oxygen Saturation 98%   Body Mass Index 24.38 kg/m  General:   Alert, NAD Lungs:  Clear .   Heart:  Regular rate and rhythm Abdomen:  Soft, nontender and nondistended. Neuro/Psych:  Alert and cooperative. Normal mood and affect. A and O x 3  Reviewed labs, radiology imaging, old records and pertinent past GI work up     K. Scherry Ran , MD [phone]

## 2022-10-04 NOTE — Transfer of Care (Signed)
Immediate Anesthesia Transfer of Care Note  Patient: Bryan Stevens  Procedure(s) Performed: COLONOSCOPY WITH PROPOFOL  Patient Location: PACU  Anesthesia Type:MAC  Level of Consciousness: drowsy  Airway & Oxygen Therapy: Patient Spontanous Breathing  Post-op Assessment: Report given to RN  Post vital signs: Reviewed and stable  Last Vitals:  Vitals Value Taken Time  BP 116/78 10/04/22 0821  Temp    Pulse 73 10/04/22 0823  Resp 19 10/04/22 0823  SpO2 97 % 10/04/22 0823  Vitals shown include unvalidated device data.  Last Pain:  Vitals:   10/04/22 0647  TempSrc: Temporal  PainSc: 0-No pain         Complications: No notable events documented.

## 2022-10-04 NOTE — Anesthesia Preprocedure Evaluation (Addendum)
Anesthesia Evaluation  Patient identified by MRN, date of birth, ID band Patient awake    Reviewed: Allergy & Precautions, NPO status , Patient's Chart, lab work & pertinent test results  History of Anesthesia Complications (+) DIFFICULT AIRWAY and history of anesthetic complications  Airway Mallampati: I  TM Distance: >3 FB Neck ROM: Full    Dental  (+) Teeth Intact, Dental Advisory Given, Caps   Pulmonary neg pulmonary ROS   Pulmonary exam normal breath sounds clear to auscultation       Cardiovascular Normal cardiovascular exam+ dysrhythmias (s/p ablation) Atrial Fibrillation  Rhythm:Regular Rate:Normal     Neuro/Psych negative neurological ROS     GI/Hepatic Neg liver ROS,,,screening colon   Endo/Other  negative endocrine ROS    Renal/GU negative Renal ROS     Musculoskeletal  (+) Arthritis ,    Abdominal   Peds  Hematology negative hematology ROS (+)   Anesthesia Other Findings Day of surgery medications reviewed with the patient.  Reproductive/Obstetrics                             Anesthesia Physical Anesthesia Plan  ASA: 2  Anesthesia Plan: MAC   Post-op Pain Management:    Induction: Intravenous  PONV Risk Score and Plan: 1 and TIVA and Treatment may vary due to age or medical condition  Airway Management Planned: Nasal Cannula and Natural Airway  Additional Equipment:   Intra-op Plan:   Post-operative Plan:   Informed Consent: I have reviewed the patients History and Physical, chart, labs and discussed the procedure including the risks, benefits and alternatives for the proposed anesthesia with the patient or authorized representative who has indicated his/her understanding and acceptance.     Dental advisory given  Plan Discussed with: CRNA and Anesthesiologist  Anesthesia Plan Comments:        Anesthesia Quick Evaluation

## 2022-10-04 NOTE — Anesthesia Procedure Notes (Signed)
Procedure Name: MAC Date/Time: 10/04/2022 7:47 AM  Performed by: Barrington Ellison, CRNAPre-anesthesia Checklist: Patient identified, Emergency Drugs available, Suction available and Timeout performed Patient Re-evaluated:Patient Re-evaluated prior to induction Oxygen Delivery Method: Nasal cannula

## 2022-10-04 NOTE — Anesthesia Postprocedure Evaluation (Signed)
Anesthesia Post Note  Patient: Bryan Stevens  Procedure(s) Performed: COLONOSCOPY WITH PROPOFOL     Patient location during evaluation: Endoscopy Anesthesia Type: MAC Level of consciousness: oriented, awake and alert and awake Pain management: pain level controlled Vital Signs Assessment: post-procedure vital signs reviewed and stable Respiratory status: spontaneous breathing, nonlabored ventilation, respiratory function stable and patient connected to nasal cannula oxygen Cardiovascular status: blood pressure returned to baseline and stable Postop Assessment: no headache, no backache and no apparent nausea or vomiting Anesthetic complications: no   No notable events documented.  Last Vitals:  Vitals:   10/04/22 0845 10/04/22 0900  BP: 115/76 114/68  Pulse: (!) 58 (!) 52  Resp: (!) 23 16  Temp:  36.7 C  SpO2: 97% 98%    Last Pain:  Vitals:   10/04/22 0900  TempSrc:   PainSc: 0-No pain                 Santa Lighter

## 2022-10-08 ENCOUNTER — Encounter (HOSPITAL_COMMUNITY): Payer: Self-pay | Admitting: Gastroenterology

## 2023-12-14 ENCOUNTER — Ambulatory Visit: Admitting: Cardiology

## 2023-12-30 ENCOUNTER — Ambulatory Visit: Payer: Medicare Other | Attending: Cardiology | Admitting: Cardiology

## 2023-12-30 ENCOUNTER — Encounter: Payer: Self-pay | Admitting: Cardiology

## 2023-12-30 VITALS — BP 100/78 | HR 68 | Ht 67.5 in | Wt 153.0 lb

## 2023-12-30 DIAGNOSIS — I480 Paroxysmal atrial fibrillation: Secondary | ICD-10-CM | POA: Insufficient documentation

## 2023-12-30 DIAGNOSIS — I4819 Other persistent atrial fibrillation: Secondary | ICD-10-CM | POA: Diagnosis not present

## 2023-12-30 DIAGNOSIS — D6869 Other thrombophilia: Secondary | ICD-10-CM | POA: Insufficient documentation

## 2023-12-30 NOTE — Progress Notes (Signed)
 Electrophysiology Office Note:   Date:  12/31/2023  ID:  Bryan Stevens, DOB [birth year], MRN [id number]  Primary Cardiologist: None Electrophysiologist: Nobie Putnam, MD      History of Present Illness:   Bryan Stevens is a 68 y.o. male with h/o HLD, paroxysmal atrial fibrillation s/p PVI 11/22/17 by Dr. Johney Frame who is being seen today for follow up evaluation.  Discussed the use of AI scribe software for clinical note transcription with the patient, who gave verbal consent to proceed.  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 The patient underwent ablation in 2019 and has been doing well since, with only two episodes of AFib, both times during long runs and when he thinks he was dehydrated. The patient reports no limitations in his physical activities and continues to run long distances. The patient also has a history of high cholesterol and was previously on a statin, but stopped due to side effects. The patient's cholesterol levels have been monitored and will be discussed with his primary care physician. The patient's parents lived into their late 73s, with the father having a heart attack and open heart surgery at 18. The patient's resting heart rate is around 48 bpm. He has no new or acute complaints today.     Review of systems complete and found to be negative unless listed in HPI.   EP Information / Studies Reviewed:    EKG is ordered today. Personal review as below.  EKG Interpretation Date/Time:  Friday December 30 2023 15:52:34 EST Ventricular Rate:  68 PR Interval:  180 QRS Duration:  86 QT Interval:  400 QTC Calculation: 425 R Axis:   -3  Text Interpretation: Normal sinus rhythm Possible Left atrial enlargement When compared with ECG of 21-Dec-2017 13:54, Vent. rate has increased BY  23 BPM Confirmed by Nobie Putnam [phone]) on 12/30/2023 4:19:11 PM   TEE  11/22/17: Study Conclusions  - Left ventricle: Systolic function was normal. The estimated    ejection fraction was in the range of 60% to 65%. Wall motion was    normal; there were no regional wall motion abnormalities.  - Mitral valve: There was mild regurgitation.  - Left atrium: No evidence of thrombus in the atrial cavity or    appendage. No evidence of thrombus in the atrial cavity or    appendage.  - Right atrium: No evidence of thrombus in the atrial cavity or    appendage. No evidence of thrombus in the atrial cavity or    appendage.  - Atrial septum: No defect or patent foramen ovale was identified.   Risk Assessment/Calculations:    CHA2DS2-VASc Score = 1   This indicates a 0.6% annual risk of stroke. The patient's score is based upon: CHF History: 0 HTN History: 0 Diabetes History: 0 Stroke History: 0 Vascular Disease History: 0 Age Score: 1 Gender Score: 0              Physical Exam:   VS:  BP 100/78   Pulse 68   Ht 5' 7.5" (1.715 m)   Wt 153 lb (69.4 kg)   BMI 23.61 kg/m    Wt Readings from Last 3 Encounters:  12/30/23 153 lb (69.4 kg)  10/04/22 158 lb (71.7 kg)  07/29/22 165 lb 3.2 oz (74.9 kg)     GEN: Well nourished, well developed in no acute distress NECK: No JVD CARDIAC: Normal rate, regular  rhythm. RESPIRATORY:  Clear to auscultation without rales, wheezing or rhonchi  ABDOMEN: Soft, non-distended EXTREMITIES:  No edema; No deformity   ASSESSMENT AND PLAN:   Bryan Stevens is a 68 y.o. male with h/o paroxysmal atrial fibrillation s/p PVI 11/22/17 by Dr. Johney Frame who is being seen today for follow up evaluation.     #. Symptomatic paroxysmal atrial fibrillation: Atrial fibrillation with an ablation in January 2019. Post-ablation, experienced AFib twice, likely due to dehydration during a long run. Currently, infrequent episodes and no significant physical limitations. 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  #. Secondary  hypercoagulable state due to atrial fibrillation:  - Continue to monitor. If increased burden of AF then he would be an appropriate candidate for redo catheter ablation. AAD therapy would also be an option.  - No daily rate control due to resting bradycardia.  -CHADSVASC score of 1. Off anti-coagulation.      Follow up with Dr. Jimmey Ralph in 12 months  Signed, Nobie Putnam, MD

## 2023-12-30 NOTE — Patient Instructions (Signed)
 Medication Instructions:  Your physician recommends that you continue on your current medications as directed. Please refer to the Current Medication list given to you today.  *If you need a refill on your cardiac medications before your next appointment, please call your pharmacy*  Follow-Up: At [facility], you and your health needs are our priority.  As part of our continuing mission to provide you with exceptional heart care, we have created designated Provider Care Teams.  These Care Teams include your primary Cardiologist (physician) and Advanced Practice Providers (APPs -  Physician Assistants and Nurse Practitioners) who all work together to provide you with the care you need, when you need it.  Your next appointment:   1 year  Provider:   You may see Nobie Putnam, MD or one of the following Advanced Practice Providers on your designated Care Team:   Francis Dowse, [place] [address] [place], [place] Sherie Don, NP Canary Brim, NP

## 2024-02-08 ENCOUNTER — Other Ambulatory Visit: Payer: Self-pay | Admitting: Urology

## 2024-02-08 DIAGNOSIS — R310 Gross hematuria: Secondary | ICD-10-CM

## 2024-02-27 ENCOUNTER — Ambulatory Visit
Admission: RE | Admit: 2024-02-27 | Discharge: 2024-02-27 | Disposition: A | Discharge status: Home / Self Care | Source: Ambulatory Visit | Attending: Urology | Admitting: Urology

## 2024-02-27 DIAGNOSIS — R310 Gross hematuria: Secondary | ICD-10-CM

## 2024-02-27 MED ORDER — IOPAMIDOL (ISOVUE-370) INJECTION 76%
100.0000 mL | Freq: Once | INTRAVENOUS | Status: AC | PRN
  Administered 2024-02-27: 100 mL via INTRAVENOUS
